# Patient Record
Sex: Female | Born: 1995 | Race: White | Hispanic: No | Marital: Single | Smoking: Never smoker
Health system: Southern US, Community
[De-identification: ages and names within clinical notes are randomized; demographics above are authoritative.]

## PROBLEM LIST (undated history)

## (undated) DIAGNOSIS — M79609 Pain in unspecified limb: Secondary | ICD-10-CM

## (undated) DIAGNOSIS — R209 Unspecified disturbances of skin sensation: Secondary | ICD-10-CM

## (undated) DIAGNOSIS — R569 Unspecified convulsions: Secondary | ICD-10-CM

## (undated) DIAGNOSIS — G43009 Migraine without aura, not intractable, without status migrainosus: Secondary | ICD-10-CM

## (undated) DIAGNOSIS — G809 Cerebral palsy, unspecified: Secondary | ICD-10-CM

## (undated) DIAGNOSIS — R51 Headache: Secondary | ICD-10-CM

## (undated) DIAGNOSIS — R55 Syncope and collapse: Secondary | ICD-10-CM

## (undated) DIAGNOSIS — G43909 Migraine, unspecified, not intractable, without status migrainosus: Secondary | ICD-10-CM

## (undated) HISTORY — DX: Pain in unspecified limb: M79.609

## (undated) HISTORY — DX: Migraine without aura, not intractable, without status migrainosus: G43.009

## (undated) HISTORY — DX: Syncope and collapse: R55

## (undated) HISTORY — PX: EYE SURGERY: SHX253

## (undated) HISTORY — DX: Headache: R51

## (undated) HISTORY — PX: TOOTH EXTRACTION: SUR596

## (undated) HISTORY — DX: Unspecified disturbances of skin sensation: R20.9

## (undated) HISTORY — DX: Migraine, unspecified, not intractable, without status migrainosus: G43.909

---
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
## 2014-01-14 ENCOUNTER — Ambulatory Visit (INDEPENDENT_AMBULATORY_CARE_PROVIDER_SITE_OTHER): Payer: Medicaid Other

## 2014-01-14 ENCOUNTER — Telehealth: Payer: Self-pay | Admitting: Neurology

## 2014-01-14 DIAGNOSIS — R55 Syncope and collapse: Secondary | ICD-10-CM

## 2014-01-14 DIAGNOSIS — M79609 Pain in unspecified limb: Secondary | ICD-10-CM

## 2014-01-14 DIAGNOSIS — R209 Unspecified disturbances of skin sensation: Secondary | ICD-10-CM

## 2014-01-14 DIAGNOSIS — R51 Headache: Secondary | ICD-10-CM

## 2014-01-14 NOTE — Telephone Encounter (Signed)
I called the patient. The EEG study was unremarkable. She has had one near syncopal events since being on Topamax. We'll follow her on this medication, see her back in several months.

## 2014-01-14 NOTE — Procedures (Signed)
    History:  Sara Hubbard is an 18 year old patient with a history of cerebral palsy and possible seizures as a child. She has episodes of feeling lightheaded, blanching the face without syncope. The patient is being evaluated for these episodes.  This is a routine EEG. No skull defects are noted. Medications include Depo-Provera, Topamax, and Ultram.   EEG classification: Normal awake and asleep  Description of the recording: The background rhythms of this recording consists of a fairly well modulated medium amplitude background activity of 9 Hz. As the record progresses, the patient initially is in the waking state, but appears to enter the early stage II sleep during the recording, with rudimentary sleep spindles and vertex sharp wave activity seen. During the wakeful state, photic stimulation is performed, and this results in a bilateral and symmetric photic driving response. Hyperventilation was also performed, and this results in a minimal buildup of the background rhythm activities without significant slowing seen. At no time during the recording does there appear to be evidence of spike or spike wave discharges or evidence of focal slowing. EKG monitor shows no evidence of cardiac rhythm abnormalities with a heart rate of 56.  Impression: This is a normal EEG recording in the waking and sleeping state. No evidence of ictal or interictal discharges were seen at any time during the recording.

## 2014-01-29 ENCOUNTER — Other Ambulatory Visit: Payer: Medicaid Other

## 2014-01-29 ENCOUNTER — Ambulatory Visit
Admission: RE | Admit: 2014-01-29 | Discharge: 2014-01-29 | Disposition: A | Discharge status: Home / Self Care | Payer: Medicaid Other | Source: Ambulatory Visit | Attending: Neurology | Admitting: Neurology

## 2014-01-29 DIAGNOSIS — R209 Unspecified disturbances of skin sensation: Secondary | ICD-10-CM

## 2014-01-29 DIAGNOSIS — M79609 Pain in unspecified limb: Secondary | ICD-10-CM

## 2014-01-29 MED ORDER — GADOBENATE DIMEGLUMINE 529 MG/ML IV SOLN
15.0000 mL | Freq: Once | INTRAVENOUS | Status: AC | PRN
  Administered 2014-01-29: 15 mL via INTRAVENOUS

## 2014-02-03 ENCOUNTER — Telehealth: Payer: Self-pay | Admitting: Neurology

## 2014-02-03 MED ORDER — TOPIRAMATE 100 MG PO TABS: 100.0000 mg | ORAL_TABLET | Freq: Every day | ORAL | Status: DC

## 2014-02-03 NOTE — Telephone Encounter (Signed)
  I called the patient. The MRI of the brain is normal. She had an event of enuresis at night ? Seizure. I will go up on the topamax to 100 mg at night  MRI brain 01/29/14:  Impression   Essentially normal MRI brain (with and without). Significant artifact  eminating from the oropharynx, partially obscures the brain parenchyma on  some sequences.

## 2014-04-07 ENCOUNTER — Encounter: Payer: Self-pay | Admitting: Podiatry

## 2014-04-07 ENCOUNTER — Telehealth: Payer: Self-pay | Admitting: *Deleted

## 2014-04-07 ENCOUNTER — Ambulatory Visit (INDEPENDENT_AMBULATORY_CARE_PROVIDER_SITE_OTHER): Payer: Medicaid Other | Admitting: Podiatry

## 2014-04-07 VITALS — BP 120/60 | HR 73 | Resp 17 | Ht 67.0 in | Wt 170.0 lb

## 2014-04-07 DIAGNOSIS — B351 Tinea unguium: Secondary | ICD-10-CM

## 2014-04-07 NOTE — Telephone Encounter (Signed)
[phone] for definitive diagnosis of fungal elements.

## 2014-04-07 NOTE — Progress Notes (Signed)
   Subjective:    Patient ID: Sara Hubbard, female    DOB: November 27, 1995, 18 y.o.   MRN: [id number]  HPI Comments: N toenail problem L B/L 1st toenails D and O  1 year C white change of color and cracking and peeling A unknown T epsom salt soaks     Review of Systems  Neurological: Positive for seizures, numbness and headaches.       Active seizures.  All other systems reviewed and are negative.      Objective:   Physical Exam  Orientated x3 white female presents with her godmother  Vascular: DP and PT pulses 2/4 bilaterally  Neurological: Ankle reflexes equal and active bilaterally The toenails 6-10and have color changes in texture changes  Musculoskeletal: Pes planus bilateral    Assessment & Plan:   Assessment: Onychomycoses 6-10  Plan: Nail fragments obtained from right and left hallux toenails as submitted for PAS and fungal culture  Notify patient not receive lab

## 2014-05-01 ENCOUNTER — Ambulatory Visit: Payer: Medicaid Other | Admitting: Adult Health

## 2014-05-05 ENCOUNTER — Encounter: Payer: Self-pay | Admitting: Podiatry

## 2014-05-09 ENCOUNTER — Encounter: Payer: Self-pay | Admitting: Adult Health

## 2014-05-09 ENCOUNTER — Telehealth: Payer: Self-pay | Admitting: *Deleted

## 2014-05-09 ENCOUNTER — Ambulatory Visit (INDEPENDENT_AMBULATORY_CARE_PROVIDER_SITE_OTHER): Payer: Medicaid Other | Admitting: Adult Health

## 2014-05-09 VITALS — BP 124/77 | HR 88 | Ht 63.0 in | Wt 166.0 lb

## 2014-05-09 DIAGNOSIS — R209 Unspecified disturbances of skin sensation: Secondary | ICD-10-CM

## 2014-05-09 DIAGNOSIS — R55 Syncope and collapse: Secondary | ICD-10-CM

## 2014-05-09 DIAGNOSIS — R51 Headache: Secondary | ICD-10-CM

## 2014-05-09 NOTE — Progress Notes (Signed)
PATIENT: Sara Hubbard DOB: 02-Mar-1996  REASON FOR VISIT: follow up HISTORY FROM: patient  HISTORY OF PRESENT ILLNESS: Sara Hubbard is an 18 year old female with a history of cerebral palsy, headaches and possible seuzires. She returns today for follow-up. she is currently taking Topamax 100 mg daily. She reports that her headaches have improved. She states that she has approximately two headaches a month.  She reports that she has not had any "seizure like" events since her Topamax was increased. She states that she has had some episodes of dizziness. She states that when she climbs steps she states that her heart will feel like its racing. She will be out of breath and feel dizzy/lightheaded. She went to the ED once for this and they told her she had an irregular heart rhythm but did not give her any further instructions. She does have some numbness in her feet that she states started after she begin Topamax. The numbness is not causing any issues, states she just was curious if it was from the Topamax. She states that she jumps in her sleep a lot. Her boyfriend has noticed it. She states that it will occasionally wake her up. She has had a sleep study but states that it was only 1 hour long.  HISTORY 12/30/13 (CW): 18 year old right-handed white female with a history of cerebral palsy, and possible seizures as a child. Currently, the patient not on antiepileptic medications. Beginning in November 2014, the patient has had some problems with discomfort in the knees on both sides. She will note onset of some numbness in the legs from the upper thigh level down that will come and go, but is not persistent. She has not had any definite issues controlling the bowels or the bladder. The patient has had an episode of staring off, and some incontinence of bowel in January 2015. The patient was told that she may have had a seizure. She is also having very frequent headaches, and she is averaging 14 or 15 headaches  a month. She does have some chronic issues with gait instability. She will have episodes of feeling lightheaded, blanched in the face, but she does not black out. The last episode of feeling lightheaded was on 12/25/2013. She reports some low back pain as well. A recent MRI of the lumbosacral spine was done and this was unremarkable. She is sent to this office for further evaluation.   REVIEW OF SYSTEMS: Full 14 system review of systems performed and notable only for:  Constitutional: N/A  Eyes: N/A Ear/Nose/Throat: N/A  Skin: N/A  Cardiovascular: N/A  Respiratory: N/A  Gastrointestinal: N/A  Genitourinary: N/A Hematology/Lymphatic: N/A  Endocrine: N/A Musculoskeletal:N/A  Allergy/Immunology: N/A  Neurological: dizziness, headache Psychiatric: N/A Sleep: restless leg   ALLERGIES: No Known Allergies  HOME MEDICATIONS: Outpatient Prescriptions Prior to Visit  Medication Sig Dispense Refill  . medroxyPROGESTERone (DEPO-PROVERA) 150 MG/ML injection Inject 150 mg into the muscle every 3 (three) months.       . topiramate (TOPAMAX) 100 MG tablet Take 1 tablet (100 mg total) by mouth at bedtime.  30 tablet  3   No facility-administered medications prior to visit.    PAST MEDICAL HISTORY: Past Medical History  Diagnosis Date  . Cerebral palsy   . Pain in limb 12/30/2013  . Disturbance of skin sensation 12/30/2013  . Migraine   . Syncope   . Headache(784.0) 12/30/2013    PAST SURGICAL HISTORY: Past Surgical History  Procedure Laterality Date  .  Eye surgery    . Tooth extraction      FAMILY HISTORY: Family History  Problem Relation Age of Onset  . Hypertension Father   . Diabetes Maternal Grandmother   . Diabetes Paternal Grandmother   . Diabetes Mother   . Seizures Neg Hx     SOCIAL HISTORY: History   Social History  . Marital Status: Single    Spouse Name: N/A    Number of Children: 0  . Years of Education: 12th   Occupational History  . student    Social  History Main Topics  . Smoking status: Never Smoker   . Smokeless tobacco: Never Used  . Alcohol Use: No  . Drug Use: No  . Sexual Activity: Not Currently    Birth Control/ Protection: Injection   Other Topics Concern  . Not on file   Social History Narrative   Patient is single with no children.   Patient is right handed.   Patient has high school education.   Patient drinks 1 cup daily.      PHYSICAL EXAM  Filed Vitals:   05/09/14 1337  BP: 124/77  Pulse: 88  Height: $Remove'5\' 3"'MLCTmYT$  (1.6 m)  Weight: 166 lb (75.297 kg)   Body mass index is 29.41 kg/(m^2). Generalized: Well developed, in no acute distress   Neurological examination  Mentation: Alert oriented to time, place, history taking. Follows all commands speech and language fluent Cranial nerve II-XII: Pupils were equal round reactive to light. Extraocular movements were full, visual field were full on confrontational test. Facial sensation and strength were normal.  Uvula tongue midline. Head turning and shoulder shrug  were normal and symmetric. Motor: The motor testing reveals 5 over 5 strength of all 4 extremities. Good symmetric motor tone is noted throughout.  Sensory: Sensory testing is intact to soft touch on all 4 extremities. No evidence of extinction is noted.  Coordination: Cerebellar testing reveals good finger-nose-finger and heel-to-shin bilaterally.  Gait and station: Gait is normal. Tandem gait is unsteady. Romberg is negative but slightly unsteady. No drift is seen.  Reflexes: Deep tendon reflexes are symmetric and normal bilaterally.    DIAGNOSTIC DATA (LABS, IMAGING, TESTING) - I reviewed patient records, labs, notes, testing and imaging myself where available.  Lab Results  Component Value Date   WBC 15.0* 05/01/2007   HGB 14.6 11/26/2013   HCT 43.0 11/26/2013   MCV 88.4 05/01/2007   PLT 264 05/01/2007      Component Value Date/Time   NA 142 11/26/2013 1442   K 3.5* 11/26/2013 1442   CL 105 11/26/2013  1442   CO2 25 05/01/2007 1700   GLUCOSE 100* 11/26/2013 1442   BUN 13 11/26/2013 1442   CREATININE 0.70 11/26/2013 1442   CALCIUM 9.7 05/01/2007 1700   GFRNONAA NOT CALCULATED 05/01/2007 1700   GFRAA  Value: NOT CALCULATED        The eGFR has been calculated using the MDRD equation. This calculation has not been validated in all clinical 05/01/2007 1700   ASSESSMENT AND PLAN 18 y.o. year old female  has a past medical history of Cerebral palsy; Pain in limb (12/30/2013); Disturbance of skin sensation (12/30/2013); Migraine; Syncope; and Headache(784.0) (12/30/2013). here with:  1. Migraines 2. Possible seizures  Since starting Topamax the patient had not had any " seizure like" events. Her migraines have also improved. She is now only having two headaches a month. She should continue the Topamax. Patient does complain of her heart racing, SOB  and dizzy/lightheaded after climbing several steps. She indicated that she went to the ED at [facility] and was told that she had an irregular heart rhythm but states she received no further instructions. I have instructed her to make an appointment with her PCP and have them evaluate this. Patient verbalized understanding. She should follow-up in 4 months or sooner if needed.   Ward Givens, MSN, NP-C 05/09/2014, 1:40 PM [facility] [address] [place], [place] [address] [phone]  Note: This document was prepared with digital dictation and possible smart phrase technology. Any transcriptional errors that result from this process are unintentional.

## 2014-05-09 NOTE — Telephone Encounter (Signed)
Called patient to see if she can come in earlier, patient agreed and will be in around 2 pm.

## 2014-05-09 NOTE — Patient Instructions (Signed)

## 2014-05-09 NOTE — Progress Notes (Signed)
I have read the note, and I agree with the clinical assessment and plan.  Sara Hubbard   

## 2014-05-26 ENCOUNTER — Ambulatory Visit: Payer: Medicaid Other | Admitting: Podiatry

## 2014-06-02 ENCOUNTER — Ambulatory Visit (INDEPENDENT_AMBULATORY_CARE_PROVIDER_SITE_OTHER): Payer: Medicaid Other | Admitting: Podiatry

## 2014-06-02 ENCOUNTER — Encounter: Payer: Self-pay | Admitting: Podiatry

## 2014-06-02 VITALS — BP 126/64 | HR 86 | Resp 12

## 2014-06-02 DIAGNOSIS — B351 Tinea unguium: Secondary | ICD-10-CM

## 2014-06-02 DIAGNOSIS — Z79899 Other long term (current) drug therapy: Secondary | ICD-10-CM

## 2014-06-02 MED ORDER — TERBINAFINE HCL 250 MG PO TABS: 250.0000 mg | ORAL_TABLET | Freq: Every day | ORAL | Status: DC

## 2014-06-03 NOTE — Progress Notes (Signed)
Patient ID: Sara Hubbard, female   DOB: 1996/08/20, 18 y.o.   MRN: [id number]  Subjective: This patient presents to discuss treatment options for onychomycoses  Objective: Results of [facility] lab received date 04/15/2014 PAS reaction demonstrates probable dermatophytes Saprophytic fungi detected Evidence of microtrauma  Assessment: Lab confirmation of onychomycoses  Plan: Discuss treatment options with patient including no treatment, topical, oral, laser. Advised patient that the most predictable results was  oral medication. Patient requests oral medication  Issued request for baseline hepatic function and if within normal limits began Terbinafine 250 mg #30,  1 daily,2 refills  Notify patient of hepatic function and if with in normal limits to begin terbinafine  Reappoint x1 month

## 2014-06-17 ENCOUNTER — Telehealth: Payer: Self-pay | Admitting: *Deleted

## 2014-06-17 NOTE — Telephone Encounter (Signed)
I had an appointment about 2 weeks ago and I've never gotten a call back about my blood work to see if I can take my medicine yet.  If you would, please give me a call back.  Thank you.

## 2014-06-24 NOTE — Telephone Encounter (Signed)
I called [facility] to get the results.  I was informed they never received a specimen.  I told her I would check with the patient because she is calling for the results.  I called the patient and informed her that we have not received any lab results.  I asked her if she had it done by somewhere else than [facility].  She stated she went to her primary care doctor.  I asked if she could call and get them to fax the results to us. Tthen we can let you know whether to proceed or not.  She stated, "I filled out the paperwork for them to release it to you.  I don't know why they haven't sent it to you.  I been waiting for weeks to start the medicine."  I asked her again to call them to get the results sent to us.  She stated she would.

## 2014-06-27 ENCOUNTER — Telehealth: Payer: Self-pay

## 2014-06-27 NOTE — Telephone Encounter (Signed)
Spoke with patient regarding hepatic liver function panel, Results were within normal range, advised patient to begin taking lamisil as prescribed and reappointed one month from today for re-evaluation

## 2014-06-30 ENCOUNTER — Ambulatory Visit: Payer: Medicaid Other | Admitting: Podiatry

## 2014-07-02 ENCOUNTER — Telehealth: Payer: Self-pay | Admitting: *Deleted

## 2014-07-02 DIAGNOSIS — B351 Tinea unguium: Secondary | ICD-10-CM

## 2014-07-02 NOTE — Telephone Encounter (Signed)
I'm calling because I had an incident with my medicine.  My niece got a hold of it and threw it away.  I'm wondering if I can get a refill.  (Lamisil)

## 2014-07-07 MED ORDER — TERBINAFINE HCL 250 MG PO TABS: 250.0000 mg | ORAL_TABLET | Freq: Every day | ORAL | Status: DC

## 2014-07-07 NOTE — Telephone Encounter (Signed)
I called and left her a message that Dr. Leeanne Deeduchman said your labs were okay.  You can start the medication.  He okayed for you to get another prescription.

## 2014-07-28 ENCOUNTER — Ambulatory Visit: Payer: Medicaid Other | Admitting: Podiatry

## 2014-08-11 ENCOUNTER — Ambulatory Visit: Payer: Medicaid Other | Admitting: Podiatry

## 2014-09-08 ENCOUNTER — Encounter: Payer: Self-pay | Admitting: Adult Health

## 2014-09-08 ENCOUNTER — Ambulatory Visit (INDEPENDENT_AMBULATORY_CARE_PROVIDER_SITE_OTHER): Payer: Medicaid Other | Admitting: Adult Health

## 2014-09-08 VITALS — BP 122/75 | HR 86 | Ht 64.0 in | Wt 180.0 lb

## 2014-09-08 DIAGNOSIS — R55 Syncope and collapse: Secondary | ICD-10-CM

## 2014-09-08 DIAGNOSIS — R519 Headache, unspecified: Secondary | ICD-10-CM

## 2014-09-08 DIAGNOSIS — R109 Unspecified abdominal pain: Secondary | ICD-10-CM | POA: Insufficient documentation

## 2014-09-08 DIAGNOSIS — R51 Headache: Secondary | ICD-10-CM

## 2014-09-08 MED ORDER — TOPIRAMATE 100 MG PO TABS: 100.0000 mg | ORAL_TABLET | Freq: Every day | ORAL | Status: DC

## 2014-09-08 NOTE — Patient Instructions (Signed)
Continue Topamax 100 mg daily.  If your headaches increase let us know.  Follow-up with PCP if stomach pain continues.

## 2014-09-08 NOTE — Progress Notes (Signed)
I have read the note, and I agree with the clinical assessment and plan.  Patrina Andreas KEITH   

## 2014-09-08 NOTE — Progress Notes (Signed)
Sara Hubbard: Sara Hubbard DOB: October 20, 1995  REASON FOR VISIT: follow up HISTORY FROM: Sara Hubbard  HISTORY OF PRESENT ILLNESS: Sara Hubbard is a 19 year old female with a history of cerebral palsy, headache and possible seizures. She returns today for follow-up. She is currently taking Topamax 100 mg daily. She reports that she has approximately 2 headaches per month. She states that she can take Ibuprofen and that will resolve it.  She denies any seizure-like events. Since the last visit she did go see her primary care provider for dizziness on exertion and an irregular heart rhythm. She states that they did  an EKG which showed PVCs. She states that the dizziness has gotten better. She has a follow-up with her PCP regarding this in several months. She states that the only new thing is pain in the right side under the breast towards the axilla that is intermittent. It occurs at random times and will resolve spontaneously. She has not told her PCP about this.   HISTORY 05/09/14: Sara Hubbard is an 19 year old female with a history of cerebral palsy, headaches and possible seizures. She returns today for follow-up. she is currently taking Topamax 100 mg daily. She reports that her headaches have improved. She states that she has approximately two headaches a month. She reports that she has not had any "seizure like" events since her Topamax was increased. She states that she has had some episodes of dizziness. She states that when she climbs steps she states that her heart will feel like its racing. She will be out of breath and feel dizzy/lightheaded. She went to the ED once for this and they told her she had an irregular heart rhythm but did not give her any further instructions. She does have some numbness in her feet that she states started after she begin Topamax. The numbness is not causing any issues, states she just was curious if it was from the Topamax. She states that she jumps in her sleep a lot. Her boyfriend  has noticed it. She states that it will occasionally wake her up. She has had a sleep study but states that it was only 1 hour long.  HISTORY 12/30/13 (CW): 19 year old right-handed white female with a history of cerebral palsy, and possible seizures as a child. Currently, the Sara Hubbard not on antiepileptic medications. Beginning in November 2014, the Sara Hubbard has had some problems with discomfort in the knees on both sides. She will note onset of some numbness in the legs from the upper thigh level down that will come and go, but is not persistent. She has not had any definite issues controlling the bowels or the bladder. The Sara Hubbard has had an episode of staring off, and some incontinence of bowel in January 2015. The Sara Hubbard was told that she may have had a seizure. She is also having very frequent headaches, and she is averaging 14 or 15 headaches a month. She does have some chronic issues with gait instability. She will have episodes of feeling lightheaded, blanched in the face, but she does not black out. The last episode of feeling lightheaded was on 12/25/2013. She reports some low back pain as well. A recent MRI of the lumbosacral spine was done and this was unremarkable. She is sent to this office for further evaluation.  REVIEW OF SYSTEMS: Out of a complete 14 system review of symptoms, the Sara Hubbard complains only of the following symptoms, and all other reviewed systems are negative.  ALLERGIES: No Known Allergies  HOME MEDICATIONS: Outpatient Prescriptions Prior to Visit  Medication Sig Dispense Refill  . medroxyPROGESTERone (DEPO-PROVERA) 150 MG/ML injection Inject 150 mg into the muscle every 3 (three) months.     . terbinafine (LAMISIL) 250 MG tablet Take 1 tablet (250 mg total) by mouth daily. 30 tablet 2  . topiramate (TOPAMAX) 100 MG tablet Take 1 tablet (100 mg total) by mouth at bedtime. 30 tablet 3   No facility-administered medications prior to visit.    PAST MEDICAL  HISTORY: Past Medical History  Diagnosis Date  . Cerebral palsy   . Pain in limb 12/30/2013  . Disturbance of skin sensation 12/30/2013  . Migraine   . Syncope   . Headache(784.0) 12/30/2013    PAST SURGICAL HISTORY: Past Surgical History  Procedure Laterality Date  . Eye surgery    . Tooth extraction      FAMILY HISTORY: Family History  Problem Relation Age of Onset  . Hypertension Father   . Diabetes Maternal Grandmother   . Diabetes Paternal Grandmother   . Diabetes Mother   . Seizures Neg Hx     SOCIAL HISTORY: History   Social History  . Marital Status: Single    Spouse Name: N/A    Number of Children: 0  . Years of Education: 12th   Occupational History  . student    Social History Main Topics  . Smoking status: Never Smoker   . Smokeless tobacco: Never Used  . Alcohol Use: No  . Drug Use: No  . Sexual Activity: Not Currently    Birth Control/ Protection: Injection   Other Topics Concern  . Not on file   Social History Narrative   Sara Hubbard is single with no children.   Sara Hubbard is right handed.   Sara Hubbard has high school education.   Sara Hubbard drinks 1 cup daily.      PHYSICAL EXAM  Filed Vitals:   09/08/14 1430  BP: 122/75  Pulse: 86  Height: $Remove'5\' 4"'OetHqAF$  (1.626 m)  Weight: 180 lb (81.647 kg)   Body mass index is 30.88 kg/(m^2). Generalized: Well developed, in no acute distress   Abdomen: No tenderness noted on palpation.  Neurological examination  Mentation: Alert oriented to time, place, history taking. Follows all commands speech and language fluent Cranial nerve II-XII: Pupils were equal round reactive to light. Extraocular movements were full, visual field were full on confrontational test. Facial sensation and strength were normal. Uvula tongue midline. Head turning and shoulder shrug  were normal and symmetric. Motor: The motor testing reveals 5 over 5 strength of all 4 extremities. Good symmetric motor tone is noted throughout.  Sensory:  Sensory testing is intact to soft touch on all 4 extremities. No evidence of extinction is noted.  Coordination: Cerebellar testing reveals good finger-nose-finger and heel-to-shin bilaterally.  Gait and station: Gait is normal. Tandem gait unsteady. Romberg is negative. No drift is seen.  Reflexes: Deep tendon reflexes are symmetric and normal bilaterally.    DIAGNOSTIC DATA (LABS, IMAGING, TESTING) - I reviewed Sara Hubbard records, labs, notes, testing and imaging myself where available.  Lab Results  Component Value Date   WBC 15.0* 05/01/2007   HGB 14.6 11/26/2013   HCT 43.0 11/26/2013   MCV 88.4 05/01/2007   PLT 264 05/01/2007      Component Value Date/Time   NA 142 11/26/2013 1442   K 3.5* 11/26/2013 1442   CL 105 11/26/2013 1442   CO2 25 05/01/2007 1700   GLUCOSE 100* 11/26/2013 1442   BUN  13 11/26/2013 1442   CREATININE 0.70 11/26/2013 1442   CALCIUM 9.7 05/01/2007 1700   GFRNONAA NOT CALCULATED 05/01/2007 1700   GFRAA  05/01/2007 1700    NOT CALCULATED        The eGFR has been calculated using the MDRD equation. This calculation has not been validated in all clinical      ASSESSMENT AND PLAN 19 y.o. year old female  has a past medical history of Cerebral palsy; Pain in limb (12/30/2013); Disturbance of skin sensation (12/30/2013); Migraine; Syncope; and Headache(784.0) (12/30/2013). here with:  1. Migraines  Overall the Sara Hubbard has remained the same. She has approximately one to 2 headaches a month. She will continue taking Topamax 100 mg daily. I will refill this today. She denies any seizure-like events. I have advised the Sara Hubbard to follow up with her primary care provider regarding the right-sided pain. If her symptoms worsen or she develops new symptoms she should let us know. She will follow-up in 6 months or sooner if needed.  Ward Givens, MSN, NP-C 09/08/2014, 2:31 PM [facility] [address] [place], [place] [address] [phone]  Note: This document was prepared with digital dictation and possible smart phrase technology. Any transcriptional errors that result from this process are unintentional.

## 2014-10-27 ENCOUNTER — Telehealth: Payer: Self-pay | Admitting: Adult Health

## 2014-10-27 MED ORDER — TOPIRAMATE 100 MG PO TABS: ORAL_TABLET | ORAL | Status: DC

## 2014-10-27 NOTE — Telephone Encounter (Signed)
Spoke to patient and she is scheduled for 12-26-14 at 0900.

## 2014-10-27 NOTE — Telephone Encounter (Signed)
Spoke to patient and she has been fighting a sinus infection, she has not missed any of her seizure medication, and has not started any new medications.  She relayed that she got really hot and then started shaking , her breathing slowed.  She was conscious, and then was sleepy afterward.

## 2014-10-27 NOTE — Telephone Encounter (Signed)
I called the patient. She states that during her seizure she was shaking in the upper torso. She was fully conscious and was able to go lay down. She denies losing her bowels or bladder. Not sure that this represents a true seizure event. She has had a sinus infection and was taking over-the-counter cold medication. She is unsure of the name of the medication she was taking. I have advised the patient to stop taking that medication until she can tell me the name of it. I will increase her Topamax to have a tablet in the a.m. and 1 tablet in the p.m. She has a follow-up appointment with me in July I will try to get the patient in within the next 2-3 months.

## 2014-10-27 NOTE — Telephone Encounter (Signed)
Lupita LeashDonna, Can you call the patient and get more details? Has she been taking her medication? Change in medication? Has she been sick or stressed? Has she started any new medication? What did her seizure consist of? Thanks.

## 2014-10-27 NOTE — Telephone Encounter (Signed)
Pt is calling due to having a seizure this morning and she has not felt well for the past couple of days just wants to sleep.  Would like for a nurse to give her a call. Please call and advise.

## 2014-11-03 ENCOUNTER — Telehealth: Payer: Self-pay | Admitting: Adult Health

## 2014-11-03 NOTE — Telephone Encounter (Signed)
Patient's requesting Megan, NP, assistance with Senior Project regarding Cerebral Palsy.  Please call and advise.

## 2014-11-04 NOTE — Telephone Encounter (Signed)
I called the patient. She would like some questions answered regarding cerebral palsy for her senior project. I gave her my email so she can email questions.

## 2014-12-15 ENCOUNTER — Ambulatory Visit: Payer: Medicaid Other | Admitting: Podiatry

## 2014-12-26 ENCOUNTER — Ambulatory Visit: Payer: Self-pay | Admitting: Adult Health

## 2015-03-11 ENCOUNTER — Ambulatory Visit (INDEPENDENT_AMBULATORY_CARE_PROVIDER_SITE_OTHER): Payer: Medicaid Other | Admitting: Adult Health

## 2015-03-11 ENCOUNTER — Encounter: Payer: Self-pay | Admitting: Adult Health

## 2015-03-11 VITALS — BP 115/70 | HR 84 | Ht 64.0 in | Wt 185.0 lb

## 2015-03-11 DIAGNOSIS — R51 Headache: Secondary | ICD-10-CM | POA: Diagnosis not present

## 2015-03-11 DIAGNOSIS — G809 Cerebral palsy, unspecified: Secondary | ICD-10-CM | POA: Diagnosis not present

## 2015-03-11 DIAGNOSIS — R519 Headache, unspecified: Secondary | ICD-10-CM

## 2015-03-11 NOTE — Patient Instructions (Signed)
Continue Topamax.

## 2015-03-11 NOTE — Progress Notes (Signed)
PATIENT: Sara Hubbard DOB: [birth year]  REASON FOR VISIT: follow up- headache HISTORY FROM: patient  HISTORY OF PRESENT ILLNESS: [name] is a 19 year old female with a history of cerebral palsy, headaches and possible seizures. She returns today for follow-up. She continues to take Topamax 50 mg in a.m. and 100 mg in the PM. She reports that this has been beneficial for her headaches. She states that she may get 2 or 3 headaches a month. This is usually triggered by the weather. She has not had any seizure-type events since the last visit. She states that she did have a panic attack during a chemistry test. The patient just recently graduated from [employer] and will be attending [employer] in the fall. Denies any new medical issues. She returns today for an evaluation.  HISTORY 09/08/14: [name] is a 19 year old female with a history of cerebral palsy, headache and possible seizures. She returns today for follow-up. She is currently taking Topamax 100 mg daily. She reports that she has approximately 2 headaches per month. She states that she can take Ibuprofen and that will resolve it. She denies any seizure-like events. Since the last visit she did go see her primary care provider for dizziness on exertion and an irregular heart rhythm. She states that they did an EKG which showed PVCs. She states that the dizziness has gotten better. She has a follow-up with her PCP regarding this in several months. She states that the only new thing is pain in the right side under the breast towards the axilla that is intermittent. It occurs at random times and will resolve spontaneously. She has not told her PCP about this.   HISTORY 05/09/14: [name] is an 19 year old female with a history of cerebral palsy, headaches and possible seizures. She returns today for follow-up. she is currently taking Topamax 100 mg daily. She reports that her headaches have improved. She states that she has  approximately two headaches a month. She reports that she has not had any "seizure like" events since her Topamax was increased. She states that she has had some episodes of dizziness. She states that when she climbs steps she states that her heart will feel like its racing. She will be out of breath and feel dizzy/lightheaded. She went to the ED once for this and they told her she had an irregular heart rhythm but did not give her any further instructions. She does have some numbness in her feet that she states started after she begin Topamax. The numbness is not causing any issues, states she just was curious if it was from the Topamax. She states that she jumps in her sleep a lot. Her boyfriend has noticed it. She states that it will occasionally wake her up. She has had a sleep study but states that it was only 1 hour long.  HISTORY 12/30/13 (CW): [age] year old right-handed white female with a history of cerebral palsy, and possible seizures as a child. Currently, the patient not on antiepileptic medications. Beginning in November 2014, the patient has had some problems with discomfort in the knees on both sides. She will note onset of some numbness in the legs from the upper thigh level down that will come and go, but is not persistent. She has not had any definite issues controlling the bowels or the bladder. The patient has had an episode of staring off, and some incontinence of bowel in January 2015. The patient was told that she may have  had a seizure. She is also having very frequent headaches, and she is averaging 14 or 15 headaches a month. She does have some chronic issues with gait instability. She will have episodes of feeling lightheaded, blanched in the face, but she does not black out. The last episode of feeling lightheaded was on 12/25/2013. She reports some low back pain as well. A recent MRI of the lumbosacral spine was done and this was unremarkable. She is sent to this office for further  evaluation.  REVIEW OF SYSTEMS: Out of a complete 14 system review of symptoms, the patient complains only of the following symptoms, and all other reviewed systems are negative.  Seizure  ALLERGIES: No Known Allergies  HOME MEDICATIONS: Outpatient Prescriptions Prior to Visit  Medication Sig Dispense Refill  . medroxyPROGESTERone (DEPO-PROVERA) 150 MG/ML injection Inject 150 mg into the muscle every 3 (three) months.     . terbinafine (LAMISIL) 250 MG tablet Take 1 tablet (250 mg total) by mouth daily. 30 tablet 2  . topiramate (TOPAMAX) 100 MG tablet Take 1/2 tablet in the a.m. and 1 tablet in the p.m. 45 tablet 3   No facility-administered medications prior to visit.    PAST MEDICAL HISTORY: Past Medical History  Diagnosis Date  . Cerebral palsy   . Pain in limb 12/30/2013  . Disturbance of skin sensation 12/30/2013  . Migraine   . Syncope   . Headache(784.0) 12/30/2013    PAST SURGICAL HISTORY: Past Surgical History  Procedure Laterality Date  . Eye surgery    . Tooth extraction      FAMILY HISTORY: Family History  Problem Relation Age of Onset  . Hypertension Father   . Diabetes Maternal Grandmother   . Diabetes Paternal Grandmother   . Diabetes Mother   . Seizures Neg Hx     SOCIAL HISTORY: History   Social History  . Marital Status: Single    Spouse Name: N/A  . Number of Children: 0  . Years of Education: 12th   Occupational History  . student    Social History Main Topics  . Smoking status: Never Smoker   . Smokeless tobacco: Never Used  . Alcohol Use: No  . Drug Use: No  . Sexual Activity: Not Currently    Birth Control/ Protection: Injection   Other Topics Concern  . Not on file   Social History Narrative   Patient is single with no children.   Patient is right handed.   Patient has college education.   Patient drinks 1 cup daily.      PHYSICAL EXAM  Filed Vitals:   03/11/15 1144  BP: 115/70  Pulse: 84  Height: 5\' 4"  (1.626  m)  Weight: 185 lb (83.915 kg)   Body mass index is 31.74 kg/(m^2).  Generalized: Well developed, in no acute distress   Neurological examination  Mentation: Alert oriented to time, place, history taking. Follows all commands speech and language fluent Cranial nerve II-XII: Pupils were equal round reactive to light. Extraocular movements were full, visual field were full on confrontational test. Facial sensation and strength were normal. Uvula tongue midline. Head turning and shoulder shrug  were normal and symmetric. Motor: The motor testing reveals 5 over 5 strength of all 4 extremities. Good symmetric motor tone is noted throughout.  Sensory: Sensory testing is intact to soft touch on all 4 extremities. No evidence of extinction is noted.  Coordination: Cerebellar testing reveals good finger-nose-finger and heel-to-shin bilaterally.  Gait and station: Gait is  normal.  Reflexes: Deep tendon reflexes are symmetric and normal bilaterally.   DIAGNOSTIC DATA (LABS, IMAGING, TESTING) - I reviewed patient records, labs, notes, testing and imaging myself where available.   ASSESSMENT AND PLAN 19 y.o. year old female  has a past medical history of Cerebral palsy; Pain in limb (12/30/2013); Disturbance of skin sensation (12/30/2013); Migraine; Syncope; and Headache(784.0) (12/30/2013). here with:  1. Headache 2. Cerebral palsy  Overall the patient is doing well. She will continue on Topamax 50 mg in the a.m. and 100 mg in the PM.  if her symptoms worsen or she develops new symptoms she should let us know. She will follow-up in 6 months or sooner if needed.     Butch Penny, MSN, NP-C 03/11/2015, 11:48 AM [facility] [address] [place], [place] [address] [phone]  Note: This document was prepared with digital dictation and possible smart phrase technology. Any transcriptional errors that result from this process are unintentional.

## 2015-03-27 ENCOUNTER — Telehealth: Payer: Self-pay | Admitting: Adult Health

## 2015-03-27 NOTE — Telephone Encounter (Signed)
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 It is for reasonable accomodation verification for college housing form that needs to be filled out. They are going to refax forms.for any questions please call  [phone], Dierdre Highman , or Amil Amen on Tuesday.

## 2015-03-31 NOTE — Telephone Encounter (Addendum)
Amil Amen from [employer] called and said she received the fax stating that the patient needed to sign it and fax it back but the patient will not be at the school until late August. The release needs to be sent to the patient. Please call and advise. [phone]).

## 2015-04-03 ENCOUNTER — Encounter: Payer: Self-pay | Admitting: Neurology

## 2015-04-03 ENCOUNTER — Ambulatory Visit (INDEPENDENT_AMBULATORY_CARE_PROVIDER_SITE_OTHER): Payer: Medicaid Other | Admitting: Neurology

## 2015-04-03 VITALS — BP 119/85 | HR 85 | Ht 64.0 in | Wt 187.4 lb

## 2015-04-03 DIAGNOSIS — R51 Headache: Secondary | ICD-10-CM

## 2015-04-03 DIAGNOSIS — R55 Syncope and collapse: Secondary | ICD-10-CM

## 2015-04-03 DIAGNOSIS — R519 Headache, unspecified: Secondary | ICD-10-CM

## 2015-04-03 MED ORDER — TOPIRAMATE ER 200 MG PO CAP24: 200.0000 mg | ORAL_CAPSULE | Freq: Every day | ORAL | Status: DC

## 2015-04-03 NOTE — Patient Instructions (Addendum)
We will check an EEG study, you are not to drive a car until further notice. We will go to long acting topiramate at 200 mg at night. Call if you are having problems. Follow up in 3 months.   Syncope Syncope is a medical term for fainting or passing out. This means you lose consciousness and drop to the ground. People are generally unconscious for less than 5 minutes. You may have some muscle twitches for up to 15 seconds before waking up and returning to normal. Syncope occurs more often in older adults, but it can happen to anyone. While most causes of syncope are not dangerous, syncope can be a sign of a serious medical problem. It is important to seek medical care.  CAUSES  Syncope is caused by a sudden drop in blood flow to the brain. The specific cause is often not determined. Factors that can bring on syncope include:  Taking medicines that lower blood pressure.  Sudden changes in posture, such as standing up quickly.  Taking more medicine than prescribed.  Standing in one place for too long.  Seizure disorders.  Dehydration and excessive exposure to heat.  Low blood sugar (hypoglycemia).  Straining to have a bowel movement.  Heart disease, irregular heartbeat, or other circulatory problems.  Fear, emotional distress, seeing blood, or severe pain. SYMPTOMS  Right before fainting, you may:  Feel dizzy or light-headed.  Feel nauseous.  See all white or all black in your field of vision.  Have cold, clammy skin. DIAGNOSIS  Your health care provider will ask about your symptoms, perform a physical exam, and perform an electrocardiogram (ECG) to record the electrical activity of your heart. Your health care provider may also perform other heart or blood tests to determine the cause of your syncope which may include:  Transthoracic echocardiogram (TTE). During echocardiography, sound waves are used to evaluate how blood flows through your heart.  Transesophageal  echocardiogram (TEE).  Cardiac monitoring. This allows your health care provider to monitor your heart rate and rhythm in real time.  Holter monitor. This is a portable device that records your heartbeat and can help diagnose heart arrhythmias. It allows your health care provider to track your heart activity for several days, if needed.  Stress tests by exercise or by giving medicine that makes the heart beat faster. TREATMENT  In most cases, no treatment is needed. Depending on the cause of your syncope, your health care provider may recommend changing or stopping some of your medicines. HOME CARE INSTRUCTIONS  Have someone stay with you until you feel stable.  Do not drive, use machinery, or play sports until your health care provider says it is okay.  Keep all follow-up appointments as directed by your health care provider.  Lie down right away if you start feeling like you might faint. Breathe deeply and steadily. Wait until all the symptoms have passed.  Drink enough fluids to keep your urine clear or pale yellow.  If you are taking blood pressure or heart medicine, get up slowly and take several minutes to sit and then stand. This can reduce dizziness. SEEK IMMEDIATE MEDICAL CARE IF:   You have a severe headache.  You have unusual pain in the chest, abdomen, or back.  You are bleeding from your mouth or rectum, or you have black or tarry stool.  You have an irregular or very fast heartbeat.  You have pain with breathing.  You have repeated fainting or seizure-like jerking during  an episode.  You faint when sitting or lying down.  You have confusion.  You have trouble walking.  You have severe weakness.  You have vision problems. If you fainted, call your local emergency services (911 in [place] Do not drive yourself to the hospital.  MAKE SURE YOU:  Understand these instructions.  Will watch your condition.  Will get help right away if you are not doing well  or get worse. Document Released: 08/22/2005 Document Revised: 08/27/2013 Document Reviewed: 10/21/2011 [facility] Patient Information 2015 [place], [place]. This information is not intended to replace advice given to you by your health care provider. Make sure you discuss any questions you have with your health care provider.

## 2015-04-03 NOTE — Progress Notes (Signed)
Reason for visit: Syncope  Sara Hubbard is an 19 y.o. female  History of present illness:  Sara Hubbard is a 19 year old right-handed white female with a history of cerebral palsy, and migraine headaches. The patient has had episodes of loss of consciousness or near loss of consciousness in the past. She has had an EEG study last year that was normal. The patient has had episodes of feeling lightheaded, with blanching of the face without loss of consciousness. The patient has also had episodes of jerking of the upper extremities without loss of consciousness. The patient recently was at the hospital at [facility] visiting her brother. They were doing endoscopy at bedside on her brother, and the patient apparently lost consciousness while sitting in a chair, had some jerking, and slid out of the chair to the floor. The patient was noted to be diaphoretic. The patient did have urinary incontinence, she did not bite her tongue. She went to the emergency room, but no evaluation was done. The patient was increased on the Topamax to 50 mg in the morning and 100 mg in the evening in February 2016. The patient apparently is still operating a motor vehicle. The patient indicates that she does not remember much about the most recent blackout event, she does not recall feeling dizzy, but she felt like she was in a "dream state" as she was going out. Her headaches have been under relatively good control. She returns for an evaluation.  Past Medical History  Diagnosis Date  . Cerebral palsy   . Pain in limb 12/30/2013  . Disturbance of skin sensation 12/30/2013  . Migraine   . Syncope   . ZHYQMVHQ(469.6) 12/30/2013    Past Surgical History  Procedure Laterality Date  . Eye surgery    . Tooth extraction      Family History  Problem Relation Age of Onset  . Hypertension Father   . Diabetes Maternal Grandmother   . Diabetes Paternal Grandmother   . Diabetes Mother   . Seizures Neg Hx     Social history:   reports that she has never smoked. She has never used smokeless tobacco. She reports that she does not drink alcohol or use illicit drugs.   No Known Allergies  Medications:  Prior to Admission medications   Medication Sig Start Date End Date Taking? Authorizing Provider  medroxyPROGESTERone (DEPO-PROVERA) 150 MG/ML injection Inject 150 mg into the muscle every 3 (three) months.     Historical Provider, MD  topiramate (TOPAMAX) 100 MG tablet Take 1/2 tablet in the a.m. and 1 tablet in the p.m. 10/27/14   Butch Penny, NP    ROS:  Out of a complete 14 system review of symptoms, the patient complains only of the following symptoms, and all other reviewed systems are negative.  Blackout  Blood pressure 119/85, pulse 85, height  (1.626 m), weight 187 lb 6.4 oz (85.004 kg).  Physical Exam  General: The patient is alert and cooperative at the time of the examination. The patient is moderately obese.  Skin: No significant peripheral edema is noted.   Neurologic Exam  Mental status: The patient is alert and oriented x 3 at the time of the examination. The patient has apparent normal recent and remote memory, with an apparently normal attention span and concentration ability.   Cranial nerves: Facial symmetry is present. Speech is normal, no aphasia or dysarthria is noted. Extraocular movements are full, with exception that there is incomplete adduction of the left  eye with right lateral gaze. Visual fields are full.  Motor: The patient has good strength in all 4 extremities.  Sensory examination: Soft touch sensation is symmetric on the face, arms, and legs.  Coordination: The patient has good finger-nose-finger and heel-to-shin bilaterally.  Gait and station: The patient has a normal gait. Tandem gait is unsteady. Romberg is negative. No drift is seen.  Reflexes: Deep tendon reflexes are symmetric.   Assessment/Plan:  1. Cerebral palsy  2. Migraine headache  3.  Syncopal events, question of seizures  The patient has had multiple events that appear to be dissimilar to one another, some events are associated with blanching of the face and dizziness, some are associated with jerking of the upper extremities without loss of consciousness, and the most recent event was associated with some jerking and urinary incontinence, loss of consciousness was noted. The events could represent vasovagal syncope, seizures do need to be considered. The patient is not to operate a motor vehicle until further notice. The patient will be switched over to long-acting topiramate taking 200 mg at night. I have cautioned them about the effects on the birth control, there is an interaction. The patient will have another EEG study. We may consider a video EEG evaluation in the future. There is some history of palpitations of the heart, the patient was told that she had an irregular heartbeat on EKG, but a cardiology evaluation was not recommended. The patient will follow-up in 3 months.  Marlan Palau MD 04/04/2015 12:21 PM  [facility] [address] [place], [place] [address]  Phone [phone] Fax [phone]

## 2015-04-07 NOTE — Telephone Encounter (Signed)
Called Sara Hubbard at [employer]. I was told to call Sara Hubbard back after 2:oo pm today.

## 2015-04-07 NOTE — Telephone Encounter (Signed)
Called patient and asked her to give me a call back I need to speak with patient. Thanks Annabelle Harman .

## 2015-04-07 NOTE — Telephone Encounter (Signed)
Pt is calling back

## 2015-04-07 NOTE — Telephone Encounter (Signed)
Called Amil Amen back and Left her a message on voice mail to please return my call.

## 2015-04-08 NOTE — Telephone Encounter (Signed)
Called Amil Amen and again an d left her a message to please give me a call back.

## 2015-04-09 NOTE — Progress Notes (Signed)
I have read the note, and I agree with the clinical assessment and plan.  Richard A. Sater, MD, PhD Certified in Neurology, Clinical Neurophysiology, Sleep Medicine, Pain Medicine and Neuroimaging  [facility] [address], [address] [place], [place] [address] [phone]  

## 2015-04-09 NOTE — Telephone Encounter (Signed)
Called Amil Amen back at [employer] and spoke to Badin. Meg relayed Amil Amen will call me back today.

## 2015-04-13 ENCOUNTER — Ambulatory Visit (INDEPENDENT_AMBULATORY_CARE_PROVIDER_SITE_OTHER): Payer: Medicaid Other | Admitting: Neurology

## 2015-04-13 ENCOUNTER — Telehealth: Payer: Self-pay | Admitting: Neurology

## 2015-04-13 DIAGNOSIS — R51 Headache: Secondary | ICD-10-CM

## 2015-04-13 DIAGNOSIS — R55 Syncope and collapse: Secondary | ICD-10-CM

## 2015-04-13 DIAGNOSIS — R519 Headache, unspecified: Secondary | ICD-10-CM

## 2015-04-13 NOTE — Telephone Encounter (Signed)
Spoke to patient today she will Amil Amen fax number beside me at [phone] I will fill out form and and send back to her.

## 2015-04-13 NOTE — Procedures (Signed)
    History:  Sara Hubbard is a 19 year old patient with a history of migraine headaches and episodes of syncope. The patient has had a recent event associated with blanching the face, and subsequent loss of consciousness. The patient is being evaluated for possible seizures.  This is a routine EEG. No skull defects are noted. Medications include Depo-Provera and Topamax.  EEG classification: Normal awake and asleep  Description of the recording: The background rhythms of this recording consists of a fairly well modulated medium amplitude background activity of 9 Hz. As the record progresses, the patient initially is in the waking state, but appears to enter the early stage II sleep during the recording, with rudimentary sleep spindles and vertex sharp wave activity seen. During the wakeful state, photic stimulation is not performed. Hyperventilation was performed, and this results in a minimal buildup of the background rhythm activities without significant slowing seen. At no time during the recording does there appear to be evidence of spike or spike wave discharges or evidence of focal slowing. EKG monitor shows no evidence of cardiac rhythm abnormalities with a heart rate of 48.  Impression: This is a normal EEG recording in the waking and sleeping state. No evidence of ictal or interictal discharges were seen at any time during the recording. Bradycardia was noted throughout the recording.

## 2015-04-13 NOTE — Telephone Encounter (Signed)
I called the patient. The patient has had an EEG study that was unremarkable. The episode recently describe may have been a fainting episode. The patient has been increased on the Topamax for the migraine, not sure I would restrict driving at this point.

## 2015-04-15 ENCOUNTER — Telehealth: Payer: Self-pay | Admitting: Adult Health

## 2015-04-15 NOTE — Telephone Encounter (Signed)
Faxed form to [facility] and I will also mail patient a copy per her request.

## 2015-04-15 NOTE — Telephone Encounter (Signed)
Called and spoke patient relayed that I received an ok conformation also mailed her a copy. [facility] and was place on hold by  Nance Pear I will have to give office a call back.

## 2015-04-15 NOTE — Telephone Encounter (Signed)
Patient is calling to discuss disability papers. She states the form that was filled out was faxed to the wrong number. Please call and discuss because patient does not have the correct fax number. Thank you.

## 2015-04-16 NOTE — Telephone Encounter (Signed)
Called and spoke to [place] they have form . [employer] relayed they didn't know if it was there phone line , I relayed I received an ok conformation on my end. Patient has been mailed a copy also. And aware of all details.

## 2015-04-30 NOTE — Telephone Encounter (Signed)
Patient called inquiring who Sara Hubbard talked to at [facility] regarding forms. She states Amil Amen is not much help. She can be reached at [phone].

## 2015-04-30 NOTE — Telephone Encounter (Signed)
Called patient back. Patient has what she needs's [employer] found form.

## 2015-07-29 ENCOUNTER — Telehealth: Payer: Self-pay

## 2015-07-29 ENCOUNTER — Encounter: Payer: Self-pay | Admitting: Adult Health

## 2015-07-29 ENCOUNTER — Ambulatory Visit (INDEPENDENT_AMBULATORY_CARE_PROVIDER_SITE_OTHER): Payer: Medicaid Other | Admitting: Adult Health

## 2015-07-29 VITALS — BP 122/68 | HR 69 | Ht 64.0 in | Wt 196.5 lb

## 2015-07-29 DIAGNOSIS — G43009 Migraine without aura, not intractable, without status migrainosus: Secondary | ICD-10-CM | POA: Diagnosis not present

## 2015-07-29 MED ORDER — TOPIRAMATE ER 200 MG PO CAP24: 200.0000 mg | ORAL_CAPSULE | Freq: Every day | ORAL | Status: DC

## 2015-07-29 NOTE — Telephone Encounter (Signed)
Patient did not come to a follow up appointment today.  

## 2015-07-29 NOTE — Patient Instructions (Signed)
Continue Topamax If your symptoms worsen or you develop new symptoms please let us know.   

## 2015-07-29 NOTE — Progress Notes (Signed)
PATIENT: Sara Hubbard DOB: 12/14/1995  REASON FOR VISIT: follow up- migraine headaches HISTORY FROM: patient  HISTORY OF PRESENT ILLNESS: Sara Hubbard is a 19 year old female with a history of cerebral palsy and migraine headaches. She returns today for follow-up visit. At the last visit she had had some syncopal episodes. The patient reports that she's not had any additional episodes. She is tolerating long-acting Topamax very well. She states that she has approximately 1-2 headaches a month. She states these headaches normally occur when she is stressed with school. Overall the patient is doing very well. She states that she is also performing very well in school at this time. She denies any new neurological symptoms. She returns today for an evaluation.   HISTORY 04/03/15: Sara Hubbard is a 19 year old right-handed white female with a history of cerebral palsy, and migraine headaches. The patient has had episodes of loss of consciousness or near loss of consciousness in the past. She has had an EEG study last year that was normal. The patient has had episodes of feeling lightheaded, with blanching of the face without loss of consciousness. The patient has also had episodes of jerking of the upper extremities without loss of consciousness. The patient recently was at the hospital at [facility] visiting her brother. They were doing endoscopy at bedside on her brother, and the patient apparently lost consciousness while sitting in a chair, had some jerking, and slid out of the chair to the floor. The patient was noted to be diaphoretic. The patient did have urinary incontinence, she did not bite her tongue. She went to the emergency room, but no evaluation was done. The patient was increased on the Topamax to 50 mg in the morning and 100 mg in the evening in February 2016. The patient apparently is still operating a motor vehicle. The patient indicates that she does not remember much about the most recent blackout  event, she does not recall feeling dizzy, but she felt like she was in a "dream state" as she was going out. Her headaches have been under relatively good control. She returns for an evaluation.    REVIEW OF SYSTEMS: Out of a complete 14 system review of symptoms, the patient complains only of the following symptoms, and all other reviewed systems are negative.  See history of present illness  ALLERGIES: No Known Allergies  HOME MEDICATIONS: Outpatient Prescriptions Prior to Visit  Medication Sig Dispense Refill  . medroxyPROGESTERone (DEPO-PROVERA) 150 MG/ML injection Inject 150 mg into the muscle every 3 (three) months.     . Topiramate ER (TROKENDI XR) 200 MG CP24 Take 200 mg by mouth at bedtime. 30 capsule 3   No facility-administered medications prior to visit.    PAST MEDICAL HISTORY: Past Medical History  Diagnosis Date  . Cerebral palsy (HCC)   . Pain in limb 12/30/2013  . Disturbance of skin sensation 12/30/2013  . Migraine   . Syncope   . Headache(784.0) 12/30/2013    PAST SURGICAL HISTORY: Past Surgical History  Procedure Laterality Date  . Eye surgery    . Tooth extraction      FAMILY HISTORY: Family History  Problem Relation Age of Onset  . Hypertension Father   . Diabetes Maternal Grandmother   . Diabetes Paternal Grandmother   . Diabetes Mother   . Seizures Neg Hx     SOCIAL HISTORY: Social History   Social History  . Marital Status: Single    Spouse Name: N/A  . Number of Children:  0  . Years of Education: 12th   Occupational History  . student    Social History Main Topics  . Smoking status: Never Smoker   . Smokeless tobacco: Never Used  . Alcohol Use: No  . Drug Use: No  . Sexual Activity: Not Currently    Birth Control/ Protection: Injection   Other Topics Concern  . Not on file   Social History Narrative   Patient is single with no children.   Patient is right handed.   Patient has college education.   Patient drinks 1 cup  daily.      PHYSICAL EXAM  Filed Vitals:   07/29/15 1038  BP: 122/68  Pulse: 69  Height: 5\' 4"  (1.626 m)  Weight: 196 lb 8 oz (89.132 kg)   Body mass index is 33.71 kg/(m^2).  Generalized: Well developed, in no acute distress   Neurological examination  Mentation: Alert oriented to time, place, history taking. Follows all commands speech and language fluent Cranial nerve II-XII: Pupils were equal round reactive to light. Extraocular movements were full, visual field were full on confrontational test. Facial sensation and strength were normal. Uvula tongue midline. Head turning and shoulder shrug  were normal and symmetric. Motor: The motor testing reveals 5 over 5 strength of all 4 extremities. Good symmetric motor tone is noted throughout.  Sensory: Sensory testing is intact to soft touch on all 4 extremities. No evidence of extinction is noted.  Coordination: Cerebellar testing reveals good finger-nose-finger and heel-to-shin bilaterally.  Gait and station: Gait is normal. Tandem gait is unsteady. Romberg is negative. No drift is seen.  Reflexes: Deep tendon reflexes are symmetric and normal bilaterally.   DIAGNOSTIC DATA (LABS, IMAGING, TESTING) - I reviewed patient records, labs, notes, testing and imaging myself where available.   ASSESSMENT AND PLAN 19 y.o. year old female  has a past medical history of Cerebral palsy (HCC); Pain in limb (12/30/2013); Disturbance of skin sensation (12/30/2013); Migraine; Syncope; and Headache(784.0) (12/30/2013). here with:  1. Migraine  Overall the patient is doing well. She will continue on trokendi. Refills have been sent. Patient advised that if her headache frequency or severity increases she should let us know. She will follow-up in 6 months or sooner if needed.   Butch PennyMegan Jazari Ober, MSN, NP-C 07/29/2015, 11:05 AM [facility] [address], [address] [place], [place] [address] [phone]

## 2015-07-29 NOTE — Progress Notes (Signed)
I agree with the above plan 

## 2015-09-10 ENCOUNTER — Ambulatory Visit: Payer: Medicaid Other | Admitting: Adult Health

## 2016-01-26 ENCOUNTER — Ambulatory Visit (INDEPENDENT_AMBULATORY_CARE_PROVIDER_SITE_OTHER): Payer: Medicaid Other | Admitting: Adult Health

## 2016-01-26 ENCOUNTER — Encounter: Payer: Self-pay | Admitting: Adult Health

## 2016-01-26 VITALS — BP 107/73 | HR 78 | Ht 64.0 in | Wt 200.2 lb

## 2016-01-26 DIAGNOSIS — G43009 Migraine without aura, not intractable, without status migrainosus: Secondary | ICD-10-CM | POA: Diagnosis not present

## 2016-01-26 MED ORDER — TOPIRAMATE ER 200 MG PO CAP24: 200.0000 mg | ORAL_CAPSULE | Freq: Every day | ORAL | Status: DC

## 2016-01-26 NOTE — Progress Notes (Signed)
PATIENT: Sara Hubbard DOB: 24-Nov-1995  REASON FOR VISIT: follow up- cerebral palsy, migraine headaches HISTORY FROM: patient  HISTORY OF PRESENT ILLNESS: Sara Hubbard is a 20 year old female with a history of cerebral palsy and migraine headaches. She returns today for follow-up. She continues to take Trokendi 200 mg at bedtime. She reports that she only has approximately 1 migraine a month. She states that when she does get a migraine she goes back to her dorm room and lays in the dark and eventually the headache subsides. She does state that she's been having more anxiety attacks. She does not have any triggers for the anxiety attack. She has had anxiety since she was a child. She has not followed with her primary care for this issue. She denies being depressed. She states that she is enjoying college. She returns today for an evaluation  History 07/29/15: Sara Hubbard is a 20 year old female with a history of cerebral palsy and migraine headaches. She returns today for follow-up visit. At the last visit she had had some syncopal episodes. The patient reports that she's not had any additional episodes. She is tolerating long-acting Topamax very well. She states that she has approximately 1-2 headaches a month. She states these headaches normally occur when she is stressed with school. Overall the patient is doing very well. She states that she is also performing very well in school at this time. She denies any new neurological symptoms. She returns today for an evaluation.   HISTORY 04/03/15: Sara Hubbard is a 20 year old right-handed white female with a history of cerebral palsy, and migraine headaches. The patient has had episodes of loss of consciousness or near loss of consciousness in the past. She has had an EEG study last year that was normal. The patient has had episodes of feeling lightheaded, with blanching of the face without loss of consciousness. The patient has also had episodes of jerking of  the upper extremities without loss of consciousness. The patient recently was at the hospital at [facility] visiting her brother. They were doing endoscopy at bedside on her brother, and the patient apparently lost consciousness while sitting in a chair, had some jerking, and slid out of the chair to the floor. The patient was noted to be diaphoretic. The patient did have urinary incontinence, she did not bite her tongue. She went to the emergency room, but no evaluation was done. The patient was increased on the Topamax to 50 mg in the morning and 100 mg in the evening in February 2016. The patient apparently is still operating a motor vehicle. The patient indicates that she does not remember much about the most recent blackout event, she does not recall feeling dizzy, but she felt like she was in a "dream state" as she was going out. Her headaches have been under relatively good control. She returns for an evaluation.    REVIEW OF SYSTEMS: Out of a complete 14 system review of symptoms, the patient complains only of the following symptoms, and all other reviewed systems are negative.  See history of present illness  ALLERGIES: No Known Allergies  HOME MEDICATIONS: Outpatient Prescriptions Prior to Visit  Medication Sig Dispense Refill  . medroxyPROGESTERone (DEPO-PROVERA) 150 MG/ML injection Inject 150 mg into the muscle every 3 (three) months.     . Topiramate ER (TROKENDI XR) 200 MG CP24 Take 200 mg by mouth at bedtime. 30 capsule 5   No facility-administered medications prior to visit.    PAST MEDICAL HISTORY: Past Medical  History  Diagnosis Date  . Cerebral palsy ([place])   . Pain in limb 12/30/2013  . Disturbance of skin sensation 12/30/2013  . Migraine   . Syncope   . Headache(784.0) 12/30/2013    PAST SURGICAL HISTORY: Past Surgical History  Procedure Laterality Date  . Eye surgery    . Tooth extraction      FAMILY HISTORY: Family History  Problem Relation Age of Onset  .  Hypertension Father   . Diabetes Maternal Grandmother   . Diabetes Paternal Grandmother   . Diabetes Mother   . Seizures Neg Hx     SOCIAL HISTORY: Social History   Social History  . Marital Status: Single    Spouse Name: N/A  . Number of Children: 0  . Years of Education: 12th   Occupational History  . student    Social History Main Topics  . Smoking status: Never Smoker   . Smokeless tobacco: Never Used  . Alcohol Use: No  . Drug Use: No  . Sexual Activity: Not Currently    Birth Control/ Protection: Injection   Other Topics Concern  . Not on file   Social History Narrative   Patient is single with no children.   Patient is right handed.   Patient has college education.   Patient drinks 1 cup daily.      PHYSICAL EXAM  Filed Vitals:   01/26/16 1304  BP: 107/73  Pulse: 78  Height: '5\' 4"'$  (1.626 m)  Weight: 200 lb 3.2 oz (90.81 kg)   Body mass index is 34.35 kg/(m^2).  Generalized: Well developed, in no acute distress   Neurological examination  Mentation: Alert oriented to time, place, history taking. Follows all commands speech and language fluent Cranial nerve II-XII: Pupils were equal round reactive to light. Extraocular movements were full, visual field were full on confrontational test. Facial sensation and strength were normal. Uvula tongue midline. Head turning and shoulder shrug  were normal and symmetric. Motor: The motor testing reveals 5 over 5 strength of all 4 extremities. Good symmetric motor tone is noted throughout.  Sensory: Sensory testing is intact to soft touch on all 4 extremities. No evidence of extinction is noted.  Coordination: Cerebellar testing reveals good finger-nose-finger and heel-to-shin bilaterally.  Gait and station: Gait is normal. Tandem gait is normal. Romberg is negative. No drift is seen.  Reflexes: Deep tendon reflexes are symmetric and normal bilaterally.   DIAGNOSTIC DATA (LABS, IMAGING, TESTING) - I reviewed  patient records, labs, notes, testing and imaging myself where available.      Component Value Date/Time   NA 142 11/26/2013 1442   K 3.5* 11/26/2013 1442   CL 105 11/26/2013 1442   CO2 25 05/01/2007 1700   GLUCOSE 100* 11/26/2013 1442   BUN 13 11/26/2013 1442   CREATININE 0.70 11/26/2013 1442   CALCIUM 9.7 05/01/2007 1700   GFRNONAA NOT CALCULATED 05/01/2007 1700   GFRAA  05/01/2007 1700    NOT CALCULATED        The eGFR has been calculated using the MDRD equation. This calculation has not been validated in all clinical      ASSESSMENT AND PLAN 20 y.o. year old female  has a past medical history of Cerebral palsy ([place]); Pain in limb (12/30/2013); Disturbance of skin sensation (12/30/2013); Migraine; Syncope; and Headache(784.0) (12/30/2013). here with:  1. Migraine headache  Overall the patient has remained stable. She will continue on Trokendi 200 mg at bedtime. I have advised that she should  follow-up with her primary care in regards to her anxiety. Patient verbalized understanding. She will follow-up in 6 months or sooner if needed.     Ward Givens, MSN, NP-C 01/26/2016, 1:10 PM [facility] [address], [place], [place] [address] [phone]

## 2016-01-26 NOTE — Patient Instructions (Signed)
Continue Trokendi 200 mg at bedtime Follow-up with PCP for anxiety If your symptoms worsen or you develop new symptoms please let us know.

## 2016-01-26 NOTE — Progress Notes (Signed)
I agree with the assessment and plan as directed by NP .The patient is known to me .   Daiya Tamer, MD  

## 2016-05-31 ENCOUNTER — Telehealth: Payer: Self-pay | Admitting: *Deleted

## 2016-05-31 NOTE — Telephone Encounter (Signed)
PA initiated for trokendi XR.  200mg  caps. [place] tracks - sent to clinical pharmacy PA # [id number]. Called tomorrow to check on this PA. [phone].

## 2016-06-01 ENCOUNTER — Telehealth: Payer: Self-pay | Admitting: Adult Health

## 2016-06-01 NOTE — Telephone Encounter (Signed)
Sara Hubbard/Fiancee [phone] called to check status of refill request for Topiramate ER (TROKENDI XR) 200 MG CP24, states patient is completely out of this medication, "headaches are really bad", has been trying to get filled since last Thursday or Friday, pharmacy has advised, they can't fill until approval from our office. Sara Hubbard advised, no DPR on file for nurse to discuss with him. Jeralyn BennettDante gave updated phone number for patient, cell [phone].

## 2016-06-01 NOTE — Telephone Encounter (Signed)
I spoke to pt and relayed that I spoke to [place] [employer] yesterday.  Approval was given for trokendi today.   [facility] did not have in stock and will have tomorrow am.   I relayed to pt.  She verbalized understanding.

## 2016-06-01 NOTE — Telephone Encounter (Signed)
PA# approval  [id number] per [place] [employer] . Spoke to [employer].  [phone].  [facility] [place] [facility] and [facility] not [facility].  Will have tomorrow am.   I notified the pt.

## 2016-08-23 ENCOUNTER — Encounter: Payer: Self-pay | Admitting: Neurology

## 2016-08-23 ENCOUNTER — Ambulatory Visit (INDEPENDENT_AMBULATORY_CARE_PROVIDER_SITE_OTHER): Payer: Medicaid Other | Admitting: Neurology

## 2016-08-23 DIAGNOSIS — G43009 Migraine without aura, not intractable, without status migrainosus: Secondary | ICD-10-CM | POA: Diagnosis not present

## 2016-08-23 HISTORY — DX: Migraine without aura, not intractable, without status migrainosus: G43.009

## 2016-08-23 MED ORDER — RIZATRIPTAN BENZOATE 10 MG PO TBDP: 10.0000 mg | ORAL_TABLET | Freq: Three times a day (TID) | ORAL | 3 refills | Status: DC | PRN

## 2016-08-23 MED ORDER — TOPIRAMATE ER 200 MG PO CAP24: 200.0000 mg | ORAL_CAPSULE | Freq: Every day | ORAL | 3 refills | Status: DC

## 2016-08-23 NOTE — Progress Notes (Signed)
Reason for visit: Migraine headache  Sara Hubbard is an 20 y.o. female  History of present illness:  Sara Hubbard is a 20 year old right-handed white female with history of cerebral palsy and migraine headache. The patient is having 1 or 2 headaches a month, she is not using any medications to stop the headache once they start, but they may last about an hour and go away. The patient has not had any more syncopal events. She reports that her paternal grandmother and grandfather had a history of seizures. The patient is on Trokendi taking 200 mg in the evening, as long she is on this medication she does well with the headache. She is tolerating the drug well. She returns to this office for an evaluation.  Past Medical History:  Diagnosis Date  . Cerebral palsy (HCC)   . Disturbance of skin sensation 12/30/2013  . Headache(784.0) 12/30/2013  . Migraine   . Pain in limb 12/30/2013  . Syncope     Past Surgical History:  Procedure Laterality Date  . EYE SURGERY    . TOOTH EXTRACTION      Family History  Problem Relation Age of Onset  . Hypertension Father   . Diabetes Maternal Grandmother   . Diabetes Paternal Grandmother   . Seizures Paternal Grandmother   . Diabetes Mother   . Seizures Paternal Grandfather     Social history:  reports that she has never smoked. She has never used smokeless tobacco. She reports that she does not drink alcohol or use drugs.   No Known Allergies  Medications:  Prior to Admission medications   Medication Sig Start Date End Date Taking? Authorizing Provider  medroxyPROGESTERone (DEPO-PROVERA) 150 MG/ML injection Inject 150 mg into the muscle every 3 (three) months.     Historical Provider, MD  Topiramate ER (TROKENDI XR) 200 MG CP24 Take 200 mg by mouth at bedtime. 01/26/16   Butch PennyMegan Millikan, NP    ROS:  Out of a complete 14 system review of symptoms, the patient complains only of the following symptoms, and all other reviewed systems are  negative.  Headaches  Blood pressure 117/77, pulse 90, height 5\' 4"  (1.626 m), weight 192 lb (87.1 kg).  Physical Exam  General: The patient is alert and cooperative at the time of the examination. The patient is moderately obese.  Skin: No significant peripheral edema is noted.   Neurologic Exam  Mental status: The patient is alert and oriented x 3 at the time of the examination. The patient has apparent normal recent and remote memory, with an apparently normal attention span and concentration ability.   Cranial nerves: Facial symmetry is present. Speech is normal, no aphasia or dysarthria is noted. Extraocular movements are full, with exception of incomplete adduction of both eyes, with superior gaze there is exotropia of the left eye. Visual fields are full.  Motor: The patient has good strength in all 4 extremities.  Sensory examination: Soft touch sensation is symmetric on the face, arms, and legs.  Coordination: The patient has good finger-nose-finger and heel-to-shin bilaterally.  Gait and station: The patient has a normal gait. Tandem gait is unsteady. Romberg is negative. No drift is seen.  Reflexes: Deep tendon reflexes are symmetric.   Assessment/Plan:  1. Common migraine headache  2. Cerebral palsy  The patient will continue the Trokendi, a prescription was called in. The patient will also be given a prescription for Maxalt to take if needed for the headache. She will follow-up in 6  months, sooner if needed.  Marlan Palau. Keith Willis MD 08/23/2016 1:53 PM  [facility] [address] [place], [place] [id number]  Phone [phone] Fax [phone]

## 2016-12-06 ENCOUNTER — Other Ambulatory Visit: Payer: Self-pay | Admitting: Adult Health

## 2016-12-07 ENCOUNTER — Telehealth: Payer: Self-pay

## 2016-12-07 MED ORDER — TOPIRAMATE ER 200 MG PO CAP24: 200.0000 mg | ORAL_CAPSULE | Freq: Every day | ORAL | 3 refills | Status: DC

## 2016-12-07 NOTE — Telephone Encounter (Signed)
Refill request for Trokendi xr  sent to requested pharmacy.

## 2017-02-17 ENCOUNTER — Other Ambulatory Visit: Payer: Self-pay | Admitting: Neurology

## 2017-02-21 ENCOUNTER — Encounter: Payer: Self-pay | Admitting: Adult Health

## 2017-02-21 ENCOUNTER — Ambulatory Visit (INDEPENDENT_AMBULATORY_CARE_PROVIDER_SITE_OTHER): Payer: Medicaid Other | Admitting: Adult Health

## 2017-02-21 VITALS — BP 126/86 | HR 99 | Wt 195.2 lb

## 2017-02-21 DIAGNOSIS — G43009 Migraine without aura, not intractable, without status migrainosus: Secondary | ICD-10-CM | POA: Diagnosis not present

## 2017-02-21 MED ORDER — DEXAMETHASONE 2 MG PO TABS: ORAL_TABLET | ORAL | 0 refills | Status: DC

## 2017-02-21 NOTE — Patient Instructions (Addendum)
Continue Trokendi 200 mg daily Try decadron to break headache cycle: Day 1: 3 tablets Day 2 : 2 tablets Day 3: 1 tablet Take in the morning If your symptoms worsen or you develop new symptoms please let us know.   Dexamethasone tablets What is this medicine? DEXAMETHASONE (dex a METH a sone) is a corticosteroid. It is commonly used to treat inflammation of the skin, joints, lungs, and other organs. Common conditions treated include asthma, allergies, and arthritis. It is also used for other conditions, such as blood disorders and diseases of the adrenal glands. This medicine may be used for other purposes; ask your health care provider or pharmacist if you have questions. COMMON BRAND NAME(S): CUSHINGS SYN[employer] DIAGNOSTIC, Decadron, DexPak Jr TaperPak, DexPak TaperPak, Zema-Pak, ZoDex, ZonaCort 11 Day, ZonaCort 7 Day What should I tell my health care provider before I take this medicine? They need to know if you have any of these conditions: -Cushing's syndrome -diabetes -glaucoma -heart problems or disease -high blood pressure -infection like herpes, measles, tuberculosis, or chickenpox -kidney disease -liver disease -mental problems -myasthenia gravis -osteoporosis -previous heart attack -seizures -stomach, ulcer or intestine disease including colitis and diverticulitis -thyroid problem -an unusual or allergic reaction to dexamethasone, corticosteroids, other medicines, lactose, foods, dyes, or preservatives -pregnant or trying to get pregnant -breast-feeding How should I use this medicine? Take this medicine by mouth with a drink of water. Follow the directions on the prescription label. Take it with food or milk to avoid stomach upset. If you are taking this medicine once a day, take it in the morning. Do not take more medicine than you are told to take. Do not suddenly stop taking your medicine because you may develop a severe reaction. Your doctor will tell you how much  medicine to take. If your doctor wants you to stop the medicine, the dose may be slowly lowered over time to avoid any side effects. Talk to your pediatrician regarding the use of this medicine in children. Special care may be needed. Patients over 22 years old may have a stronger reaction and need a smaller dose. Overdosage: If you think you have taken too much of this medicine contact a poison control center or emergency room at once. NOTE: This medicine is only for you. Do not share this medicine with others. What if I miss a dose? If you miss a dose, take it as soon as you can. If it is almost time for your next dose, talk to your doctor or health care professional. You may need to miss a dose or take an extra dose. Do not take double or extra doses without advice. What may interact with this medicine? Do not take this medicine with any of the following medications: -mifepristone, RU-486 -vaccines This medicine may also interact with the following medications: -amphotericin B -antibiotics like clarithromycin, erythromycin, and troleandomycin -aspirin and aspirin-like drugs -barbiturates like phenobarbital -carbamazepine -cholestyramine -cholinesterase inhibitors like donepezil, galantamine, rivastigmine, and tacrine -cyclosporine -digoxin -diuretics -ephedrine -female hormones, like estrogens or progestins and birth control pills -indinavir -isoniazid -ketoconazole -medicines for diabetes -medicines that improve muscle tone or strength for conditions like myasthenia gravis -NSAIDs, medicines for pain and inflammation, like ibuprofen or naproxen -phenytoin -rifampin -thalidomide -warfarin This list may not describe all possible interactions. Give your health care provider a list of all the medicines, herbs, non-prescription drugs, or dietary supplements you use. Also tell them if you smoke, drink alcohol, or use illegal drugs. Some items may interact with your medicine.  What  should I watch for while using this medicine? Visit your doctor or health care professional for regular checks on your progress. If you are taking this medicine over a prolonged period, carry an identification card with your name and address, the type and dose of your medicine, and your doctor's name and address. This medicine may increase your risk of getting an infection. Stay away from people who are sick. Tell your doctor or health care professional if you are around anyone with measles or chickenpox. If you are going to have surgery, tell your doctor or health care professional that you have taken this medicine within the last twelve months. Ask your doctor or health care professional about your diet. You may need to lower the amount of salt you eat. The medicine can increase your blood sugar. If you are a diabetic check with your doctor if you need help adjusting the dose of your diabetic medicine. What side effects may I notice from receiving this medicine? Side effects that you should report to your doctor or health care professional as soon as possible: -allergic reactions like skin rash, itching or hives, swelling of the face, lips, or tongue -changes in vision -fever, sore throat, sneezing, cough, or other signs of infection, wounds that will not heal -increased thirst -mental depression, mood swings, mistaken feelings of self importance or of being mistreated -pain in hips, back, ribs, arms, shoulders, or legs -redness, blistering, peeling or loosening of the skin, including inside the mouth -trouble passing urine or change in the amount of urine -swelling of feet or lower legs -unusual bleeding or bruising Side effects that usually do not require medical attention (report to your doctor or health care professional if they continue or are bothersome): -headache -nausea, vomiting -skin problems, acne, thin and shiny skin -weight gain This list may not describe all possible side  effects. Call your doctor for medical advice about side effects. You may report side effects to FDA at 1-800-FDA-1088. Where should I keep my medicine? Keep out of the reach of children. Store at room temperature between 20 and 25 degrees C (68 and 77 degrees F). Protect from light. Throw away any unused medicine after the expiration date. NOTE: This sheet is a summary. It may not cover all possible information. If you have questions about this medicine, talk to your doctor, pharmacist, or health care provider.  2018 Elsevier/Gold Standard (2007-12-13 14:02:13)

## 2017-02-21 NOTE — Progress Notes (Signed)
PATIENT: Sara Hubbard DOB: 02/20/1996  REASON FOR VISIT: follow up- cerebral palsy, migraine headache HISTORY FROM: patient  HISTORY OF PRESENT ILLNESS: Sara Hubbard is a 21 year old female with a history of cerebral palsy and migraine headaches. She returns today for follow-up. She is currently taking trokendi 200 mg at bedtime. She states that she's been under a lot of stress here recently. She is no longer at [facility]. She states her mom has had some issues and the state took away her brother and her mom was put in jail. She is currently trying to get custody of her brother who is 58. She states that she's been having daily headaches since all of this started. Her headaches always occur on the right side. When she does take Maxalt it still takes approximately 1-2 hours for headaches to resolve. She reports that she is not been taking Maxalt daily. She does have nausea with her headaches. She returns today for an evaluation.   HISTORY 08/23/16: Sara Hubbard is a 21 year old right-handed white female with history of cerebral palsy and migraine headache. The patient is having 1 or 2 headaches a month, she is not using any medications to stop the headache once they start, but they may last about an hour and go away. The patient has not had any more syncopal events. She reports that her paternal grandmother and grandfather had a history of seizures. The patient is on Trokendi taking 200 mg in the evening, as long she is on this medication she does well with the headache. She is tolerating the drug well. She returns to this office for an evaluation.  REVIEW OF SYSTEMS: Out of a complete 14 system review of symptoms, the patient complains only of the following symptoms, and all other reviewed systems are negative.   Swollen lymph nodes, headache  ALLERGIES: No Known Allergies  HOME MEDICATIONS: Outpatient Medications Prior to Visit  Medication Sig Dispense Refill  . medroxyPROGESTERone  (DEPO-PROVERA) 150 MG/ML injection Inject 150 mg into the muscle every 3 (three) months.     . rizatriptan (MAXALT-MLT) 10 MG disintegrating tablet TAKE 1 TABLET BY MOUTH THREE TIMES DAILY AS NEEDED FOR MIGRAINE 9 tablet 3  . Topiramate ER (TROKENDI XR) 200 MG CP24 Take 200 mg by mouth at bedtime. 90 capsule 3   No facility-administered medications prior to visit.     PAST MEDICAL HISTORY: Past Medical History:  Diagnosis Date  . Cerebral palsy ([place])   . Common migraine 08/23/2016  . Disturbance of skin sensation 12/30/2013  . Headache(784.0) 12/30/2013  . Migraine   . Pain in limb 12/30/2013  . Syncope     PAST SURGICAL HISTORY: Past Surgical History:  Procedure Laterality Date  . EYE SURGERY    . TOOTH EXTRACTION      FAMILY HISTORY: Family History  Problem Relation Age of Onset  . Hypertension Father   . Diabetes Maternal Grandmother   . Diabetes Paternal Grandmother   . Seizures Paternal Grandmother   . Diabetes Mother   . Seizures Paternal Grandfather     SOCIAL HISTORY: Social History   Social History  . Marital status: Single    Spouse name: N/A  . Number of children: 0  . Years of education: 12th   Occupational History  . Student    Social History Main Topics  . Smoking status: Never Smoker  . Smokeless tobacco: Never Used  . Alcohol use No  . Drug use: No  . Sexual activity: Not Currently  Birth control/ protection: Injection   Other Topics Concern  . Not on file   Social History Narrative   Patient is single with no children.   Patient is right handed.   Patient has college education.   Patient drinks 1 cup daily.      PHYSICAL EXAM  Vitals:   02/21/17 1413  BP: 126/86  Pulse: 99  Weight: 195 lb 3.2 oz (88.5 kg)   Body mass index is 33.51 kg/m.  Generalized: Well developed, in no acute distress  Neck: swollen lymph nodes.   Neurological examination  Mentation: Alert oriented to time, place, history taking. Follows all  commands speech and language fluent Cranial nerve II-XII: Pupils were equal round reactive to light. Extraocular movements were full, visual field were full on confrontational test. Facial sensation and strength were normal. Uvula tongue midline. Head turning and shoulder shrug  were normal and symmetric. Motor: The motor testing reveals 5 over 5 strength of all 4 extremities. Good symmetric motor tone is noted throughout.  Sensory: Sensory testing is intact to soft touch on all 4 extremities. No evidence of extinction is noted.  Coordination: Cerebellar testing reveals good finger-nose-finger and heel-to-shin bilaterally.  Gait and station: Gait is normal. Tandem gait is normal. Romberg is negative. No drift is seen.  Reflexes: Deep tendon reflexes are symmetric and normal bilaterally.   DIAGNOSTIC DATA (LABS, IMAGING, TESTING) - I reviewed patient records, labs, notes, testing and imaging myself where available.  Lab Results  Component Value Date   WBC 15.0 (H) 05/01/2007   HGB 14.6 11/26/2013   HCT 43.0 11/26/2013   MCV 88.4 05/01/2007   PLT 264 05/01/2007      Component Value Date/Time   NA 142 11/26/2013 1442   K 3.5 (L) 11/26/2013 1442   CL 105 11/26/2013 1442   CO2 25 05/01/2007 1700   GLUCOSE 100 (H) 11/26/2013 1442   BUN 13 11/26/2013 1442   CREATININE 0.70 11/26/2013 1442   CALCIUM 9.7 05/01/2007 1700   GFRNONAA NOT CALCULATED 05/01/2007 1700   GFRAA  05/01/2007 1700    NOT CALCULATED        The eGFR has been calculated using the MDRD equation. This calculation has not been validated in all clinical      ASSESSMENT AND PLAN 21 y.o. year old female  has a past medical history of Cerebral palsy ([place]); Common migraine (08/23/2016); Disturbance of skin sensation (12/30/2013); XLKGMWNU(272.5) (12/30/2013); Migraine; Pain in limb (12/30/2013); and Syncope. here with:  1. Migraine headaches  Her headaches have gotten worse that she's had added stress. She will continue on  Trokendi 200 mg daily. I will give her a Decadron Dosepak to break her headache cycle. She is advised that if her headaches do not improve or if they worsen she should let us know. She will follow-up in 6 months or sooner if needed.   Ward Givens, MSN, NP-C 02/21/2017, 1:59 PM [facility] [address] [place], [place] [address] [phone]

## 2017-02-22 NOTE — Progress Notes (Signed)
I have read the note, and I agree with the clinical assessment and plan.  Aadin Gaut A. Matthias Bogus, MD, PhD, FAAN Certified in Neurology, Clinical Neurophysiology, Sleep Medicine, Pain Medicine and Neuroimaging  [facility] [address], [address] [place], [place] [address] [phone]  

## 2017-06-09 ENCOUNTER — Telehealth: Payer: Self-pay | Admitting: *Deleted

## 2017-06-09 NOTE — Telephone Encounter (Signed)
Called [place] [employer].  IR# [id number].  Spoke with Rosanne Ashing.  [place] Approval [id number]  06/09/17 thru 9/30/219.  Trokendi ER  tabs.  ID [id number] Q.  LMVM at [facility] with this information.  They are to call back if needed.

## 2017-08-23 ENCOUNTER — Encounter (INDEPENDENT_AMBULATORY_CARE_PROVIDER_SITE_OTHER): Payer: Self-pay

## 2017-08-23 ENCOUNTER — Encounter: Payer: Self-pay | Admitting: Adult Health

## 2017-08-23 ENCOUNTER — Ambulatory Visit: Payer: Medicaid Other | Admitting: Adult Health

## 2017-08-23 VITALS — BP 119/69 | HR 63 | Ht 64.0 in | Wt 191.6 lb

## 2017-08-23 DIAGNOSIS — G43009 Migraine without aura, not intractable, without status migrainosus: Secondary | ICD-10-CM

## 2017-08-23 MED ORDER — RIZATRIPTAN BENZOATE 10 MG PO TBDP: ORAL_TABLET | ORAL | 3 refills | Status: DC

## 2017-08-23 NOTE — Patient Instructions (Signed)
Your Plan:  Continue Trokendi  If your symptoms worsen or you develop new symptoms please let us know.   Thank you for coming to see [facility] at [facility]. I hope we have been able to provide you high quality care today.  You may receive a patient satisfaction survey over the next few weeks. We would appreciate your feedback and comments so that we may continue to improve ourselves and the health of our patients.

## 2017-08-23 NOTE — Progress Notes (Signed)
PATIENT: Sara Hubbard DOB: [birth year]  REASON FOR VISIT: follow up HISTORY FROM: patient  HISTORY OF PRESENT ILLNESS: Today 08/23/17 Sara Hubbard is a 21 year old female with a history of cerebral palsy and migraine headaches.  She returns today for follow-up.  She remains on Trokendi 200 mg at bedtime.  She reports that her headaches are under relatively good control.  She states that she really has a headache until she is under a lot of stress.  She states that she has a lot of family issues and when different things arise her headaches seem to increase in frequency.  She is currently happy with Trokendi.  She is also on Maxalt and feels that it works well for her acute headache.  She states that she had an event last Friday where she started her menstrual cycle and was very shaky.  She reports that this lasted for about 30 minutes.  She states that she felt like she cannot lift her head off the pillow.  She does not feel that she had any type of seizure event.  Reports that she has not had any additional events since then.  She returns today for an evaluation.  HISTORY February 21, 2017: Sara Hubbard is a 21 year old female with a history of cerebral palsy and migraine headaches. She returns today for follow-up. She is currently taking trokendi 200 mg at bedtime. She states that she's been under a lot of stress here recently. She is no longer at [facility]. She states her mom has had some issues and the state took away her brother and her mom was put in jail. She is currently trying to get custody of her brother who is 26. She states that she's been having daily headaches since all of this started. Her headaches always occur on the right side. When she does take Maxalt it still takes approximately 1-2 hours for headaches to resolve. She reports that she is not been taking Maxalt daily. She does have nausea with her headaches. She returns today for an evaluation.  REVIEW OF SYSTEMS: Out of a complete  14 system review of symptoms, the patient complains only of the following symptoms, and all other reviewed systems are negative.  See HPI  ALLERGIES: No Known Allergies  HOME MEDICATIONS: Outpatient Medications Prior to Visit  Medication Sig Dispense Refill  . dexamethasone (DECADRON) 2 MG tablet Take 3 tablets PO the first day, then 2 tablets the 2nd day, then 1 tablet the 3rd day 6 tablet 0  . medroxyPROGESTERone (DEPO-PROVERA) 150 MG/ML injection Inject 150 mg into the muscle every 3 (three) months.     . rizatriptan (MAXALT-MLT) 10 MG disintegrating tablet TAKE 1 TABLET BY MOUTH THREE TIMES DAILY AS NEEDED FOR MIGRAINE 9 tablet 3  . Topiramate ER (TROKENDI XR) 200 MG CP24 Take 200 mg by mouth at bedtime. 90 capsule 3   No facility-administered medications prior to visit.     PAST MEDICAL HISTORY: Past Medical History:  Diagnosis Date  . Cerebral palsy ([place])   . Common migraine 08/23/2016  . Disturbance of skin sensation 12/30/2013  . Headache(784.0) 12/30/2013  . Migraine   . Pain in limb 12/30/2013  . Syncope     PAST SURGICAL HISTORY: Past Surgical History:  Procedure Laterality Date  . EYE SURGERY    . TOOTH EXTRACTION      FAMILY HISTORY: Family History  Problem Relation Age of Onset  . Hypertension Father   . Diabetes Maternal Grandmother   .  Diabetes Paternal Grandmother   . Seizures Paternal Grandmother   . Diabetes Mother   . Seizures Paternal Grandfather     SOCIAL HISTORY: Social History   Socioeconomic History  . Marital status: Single    Spouse name: Not on file  . Number of children: 0  . Years of education: 12th  . Highest education level: Not on file  Social Needs  . Financial resource strain: Not on file  . Food insecurity - worry: Not on file  . Food insecurity - inability: Not on file  . Transportation needs - medical: Not on file  . Transportation needs - non-medical: Not on file  Occupational History  . Occupation: Ship broker  Tobacco  Use  . Smoking status: Never Smoker  . Smokeless tobacco: Never Used  Substance and Sexual Activity  . Alcohol use: No    Alcohol/week: 0.0 oz  . Drug use: No  . Sexual activity: Not Currently    Birth control/protection: Injection  Other Topics Concern  . Not on file  Social History Narrative   Patient is single with no children.   Patient is right handed.   Patient has college education.   Patient drinks 1 cup daily.      PHYSICAL EXAM  Vitals:   08/23/17 1319  BP: 119/69  Pulse: 63  Weight: 191 lb 9.6 oz (86.9 kg)  Height: '5\' 4"'$  (1.626 m)   Body mass index is 32.89 kg/m.  Generalized: Well developed, in no acute distress   Neurological examination  Mentation: Alert oriented to time, place, history taking. Follows all commands speech and language fluent Cranial nerve II-XII: Pupils were equal round reactive to light. Extraocular movements were full, visual field were full on confrontational test. Facial sensation and strength were normal. Uvula tongue midline. Head turning and shoulder shrug  were normal and symmetric. Motor: The motor testing reveals 5 over 5 strength of all 4 extremities. Good symmetric motor tone is noted throughout.  Sensory: Sensory testing is intact to soft touch on all 4 extremities. No evidence of extinction is noted.  Coordination: Cerebellar testing reveals good finger-nose-finger and heel-to-shin bilaterally.  Gait and station: Gait is normal.  Reflexes: Deep tendon reflexes are symmetric and normal bilaterally.   DIAGNOSTIC DATA (LABS, IMAGING, TESTING) - I reviewed patient records, labs, notes, testing and imaging myself where available.  Lab Results  Component Value Date   WBC 15.0 (H) 05/01/2007   HGB 14.6 11/26/2013   HCT 43.0 11/26/2013   MCV 88.4 05/01/2007   PLT 264 05/01/2007      Component Value Date/Time   NA 142 11/26/2013 1442   K 3.5 (L) 11/26/2013 1442   CL 105 11/26/2013 1442   CO2 25 05/01/2007 1700   GLUCOSE  100 (H) 11/26/2013 1442   BUN 13 11/26/2013 1442   CREATININE 0.70 11/26/2013 1442   CALCIUM 9.7 05/01/2007 1700   GFRNONAA NOT CALCULATED 05/01/2007 1700   GFRAA  05/01/2007 1700    NOT CALCULATED        The eGFR has been calculated using the MDRD equation. This calculation has not been validated in all clinical     ASSESSMENT AND PLAN 21 y.o. year old female  has a past medical history of Cerebral palsy ([place]), Common migraine (08/23/2016), Disturbance of skin sensation (12/30/2013), Headache(784.0) (12/30/2013), Migraine, Pain in limb (12/30/2013), and Syncope. here with:  1.  Migraine headaches  Overall the patient is doing well.  She will continue on Trokendi 200 mg daily.  Advised that if she has any additional events or she experiences shakiness and weakness she should let us know.  If her headache frequency increases she should also make [place] aware.  She will follow-up in 6 months or sooner if needed.  I spent 15 minutes with the patient. 50% of this time was spent discussing her medication and recent event.     Ward Givens, MSN, NP-C 08/23/2017, 1:20 PM [facility] [address] [place], [place] [address] [phone]

## 2017-08-23 NOTE — Progress Notes (Signed)
I have read the note, and I agree with the clinical assessment and plan.  Charles K Willis   

## 2018-02-02 ENCOUNTER — Other Ambulatory Visit: Payer: Self-pay | Admitting: Neurology

## 2018-02-26 ENCOUNTER — Ambulatory Visit: Payer: Medicaid Other | Admitting: Adult Health

## 2018-02-26 ENCOUNTER — Encounter: Payer: Self-pay | Admitting: Adult Health

## 2018-02-26 VITALS — BP 115/78 | HR 102 | Ht 64.0 in | Wt 196.4 lb

## 2018-02-26 DIAGNOSIS — G43009 Migraine without aura, not intractable, without status migrainosus: Secondary | ICD-10-CM

## 2018-02-26 MED ORDER — TOPIRAMATE ER 200 MG PO CAP24: 1.0000 | ORAL_CAPSULE | Freq: Every day | ORAL | 3 refills | Status: DC

## 2018-02-26 NOTE — Progress Notes (Signed)
PATIENT: Tyrihanna Wingert DOB: 10-10-1995  REASON FOR VISIT: follow up HISTORY FROM: patient  HISTORY OF PRESENT ILLNESS: Today 02/26/18:  Ms. Gerety Is a 22 year old female with a history of cerebral palsy and migraine headaches.  Reports that she is doing well.  He has approximately 1 migraine a month.  She remains on Trokendi 200 mg daily.  She reports that she continues to take Maxalt with good benefit.  She is currently taking Depo shot for birth control.  She states that she is considering other birth control method.  She reports that she did run out of Trokendi for several days.  She reports that she did have some breakthrough headaches during that time.  She returns today for follow-up.   HISTORY 08/23/17 Ms. Balow is a 22 year old female with a history of cerebral palsy and migraine headaches.  She returns today for follow-up.  She remains on Trokendi 200 mg at bedtime.  She reports that her headaches are under relatively good control.  She states that she really has a headache until she is under a lot of stress.  She states that she has a lot of family issues and when different things arise her headaches seem to increase in frequency.  She is currently happy with Trokendi.  She is also on Maxalt and feels that it works well for her acute headache.  She states that she had an event last Friday where she started her menstrual cycle and was very shaky.  She reports that this lasted for about 30 minutes.  She states that she felt like she cannot lift her head off the pillow.  She does not feel that she had any type of seizure event.  Reports that she has not had any additional events since then.  She returns today for an evaluation.   REVIEW OF SYSTEMS: Out of a complete 14 system review of symptoms, the patient complains only of the following symptoms, and all other reviewed systems are negative.  See HPI  ALLERGIES: No Known Allergies  HOME MEDICATIONS: Outpatient Medications Prior to  Visit  Medication Sig Dispense Refill  . medroxyPROGESTERone (DEPO-PROVERA) 150 MG/ML injection Inject 150 mg into the muscle every 3 (three) months.     . rizatriptan (MAXALT-MLT) 10 MG disintegrating tablet TAKE 1 TABLET BY MOUTH THREE TIMES DAILY AS NEEDED FOR MIGRAINE 9 tablet 3  . TROKENDI XR 200 MG CP24 TAKE ONE CAPSULE BY MOUTH EVERY DAY 90 capsule 0  . dexamethasone (DECADRON) 2 MG tablet Take 3 tablets PO the first day, then 2 tablets the 2nd day, then 1 tablet the 3rd day 6 tablet 0   No facility-administered medications prior to visit.     PAST MEDICAL HISTORY: Past Medical History:  Diagnosis Date  . Cerebral palsy ([place])   . Common migraine 08/23/2016  . Disturbance of skin sensation 12/30/2013  . Headache(784.0) 12/30/2013  . Migraine   . Pain in limb 12/30/2013  . Syncope     PAST SURGICAL HISTORY: Past Surgical History:  Procedure Laterality Date  . EYE SURGERY    . TOOTH EXTRACTION      FAMILY HISTORY: Family History  Problem Relation Age of Onset  . Hypertension Father   . Diabetes Maternal Grandmother   . Diabetes Paternal Grandmother   . Seizures Paternal Grandmother   . Diabetes Mother   . Seizures Paternal Grandfather     SOCIAL HISTORY: Social History   Socioeconomic History  . Marital status: Single  Spouse name: Not on file  . Number of children: 0  . Years of education: 12th  . Highest education level: Not on file  Occupational History  . Occupation: Ship broker  Social Needs  . Financial resource strain: Not on file  . Food insecurity:    Worry: Not on file    Inability: Not on file  . Transportation needs:    Medical: Not on file    Non-medical: Not on file  Tobacco Use  . Smoking status: Never Smoker  . Smokeless tobacco: Never Used  Substance and Sexual Activity  . Alcohol use: No    Alcohol/week: 0.0 oz  . Drug use: No  . Sexual activity: Not Currently    Birth control/protection: Injection  Lifestyle  . Physical activity:      Days per week: Not on file    Minutes per session: Not on file  . Stress: Not on file  Relationships  . Social connections:    Talks on phone: Not on file    Gets together: Not on file    Attends religious service: Not on file    Active member of club or organization: Not on file    Attends meetings of clubs or organizations: Not on file    Relationship status: Not on file  . Intimate partner violence:    Fear of current or ex partner: Not on file    Emotionally abused: Not on file    Physically abused: Not on file    Forced sexual activity: Not on file  Other Topics Concern  . Not on file  Social History Narrative   Patient is single with no children.   Patient is right handed.   Patient has college education.   Patient drinks 1 cup daily.      PHYSICAL EXAM  Vitals:   02/26/18 1304  BP: 115/78  Pulse: (!) 102  Weight: 196 lb 6.4 oz (89.1 kg)  Height: '5\' 4"'$  (1.626 m)   Body mass index is 33.71 kg/m.  Generalized: Well developed, in no acute distress   Neurological examination  Mentation: Alert oriented to time, place, history taking. Follows all commands speech and language fluent Cranial nerve II-XII: Pupils were equal round reactive to light. Extraocular movements were full, visual field were full on confrontational test. Facial sensation and strength were normal. Uvula tongue midline. Head turning and shoulder shrug  were normal and symmetric. Motor: The motor testing reveals 5 over 5 strength of all 4 extremities. Good symmetric motor tone is noted throughout.  Sensory: Sensory testing is intact to soft touch on all 4 extremities. No evidence of extinction is noted.  Coordination: Cerebellar testing reveals good finger-nose-finger and heel-to-shin bilaterally.  Gait and station: Gait is normal. Tandem gait is unsteady.. Romberg is negative. No drift is seen.  Reflexes: Deep tendon reflexes are symmetric and normal bilaterally.   DIAGNOSTIC DATA (LABS,  IMAGING, TESTING) - I reviewed patient records, labs, notes, testing and imaging myself where available.  Lab Results  Component Value Date   WBC 15.0 (H) 05/01/2007   HGB 14.6 11/26/2013   HCT 43.0 11/26/2013   MCV 88.4 05/01/2007   PLT 264 05/01/2007      Component Value Date/Time   NA 142 11/26/2013 1442   K 3.5 (L) 11/26/2013 1442   CL 105 11/26/2013 1442   CO2 25 05/01/2007 1700   GLUCOSE 100 (H) 11/26/2013 1442   BUN 13 11/26/2013 1442   CREATININE 0.70 11/26/2013 1442  CALCIUM 9.7 05/01/2007 1700   GFRNONAA NOT CALCULATED 05/01/2007 1700   GFRAA  05/01/2007 1700    NOT CALCULATED        The eGFR has been calculated using the MDRD equation. This calculation has not been validated in all clinical      ASSESSMENT AND PLAN 21 y.o. year old female  has a past medical history of Cerebral palsy ([place]), Common migraine (08/23/2016), Disturbance of skin sensation (12/30/2013), Headache(784.0) (12/30/2013), Migraine, Pain in limb (12/30/2013), and Syncope. here with:  1.  Migraine headache  Overall patient is doing well.  She will continue on Trokendi 200 mg daily.  I have advised that Trokendi is not recommended in pregnancy.  I did recommend that she use an effective birth control method while taking his medication.  If she decides to come off her birth control she will let [place] know.  She will follow-up in 6 months or sooner if needed.  I spent 15 minutes with the patient. 50% of this time was spent discussing her migraine headaches   Ward Givens, MSN, NP-C 02/26/2018, 1:41 PM [facility] [address] [place], [place] [address] [phone]

## 2018-02-26 NOTE — Patient Instructions (Signed)
Your Plan:  Continue Trokendi  If your symptoms worsen or you develop new symptoms please let us know.   Thank you for coming to see us at [facility]. I hope we have been able to provide you high quality care today.  You may receive a patient satisfaction survey over the next few weeks. We would appreciate your feedback and comments so that we may continue to improve ourselves and the health of our patients.  

## 2018-02-26 NOTE — Progress Notes (Signed)
I have read the note, and I agree with the clinical assessment and plan.  Jeffie Widdowson K Tanda Morrissey   

## 2018-05-30 ENCOUNTER — Emergency Department (HOSPITAL_COMMUNITY): Payer: Medicaid Other

## 2018-05-30 ENCOUNTER — Observation Stay (HOSPITAL_COMMUNITY)
Admission: EM | Admit: 2018-05-30 | Discharge: 2018-06-01 | Disposition: A | Discharge status: Home / Self Care | Payer: Medicaid Other | Attending: Internal Medicine | Admitting: Internal Medicine

## 2018-05-30 ENCOUNTER — Other Ambulatory Visit: Payer: Self-pay

## 2018-05-30 ENCOUNTER — Encounter (HOSPITAL_COMMUNITY): Payer: Self-pay | Admitting: *Deleted

## 2018-05-30 DIAGNOSIS — Z79899 Other long term (current) drug therapy: Secondary | ICD-10-CM | POA: Insufficient documentation

## 2018-05-30 DIAGNOSIS — G459 Transient cerebral ischemic attack, unspecified: Secondary | ICD-10-CM | POA: Diagnosis present

## 2018-05-30 DIAGNOSIS — G43909 Migraine, unspecified, not intractable, without status migrainosus: Secondary | ICD-10-CM | POA: Diagnosis not present

## 2018-05-30 DIAGNOSIS — G809 Cerebral palsy, unspecified: Secondary | ICD-10-CM | POA: Diagnosis not present

## 2018-05-30 DIAGNOSIS — E876 Hypokalemia: Principal | ICD-10-CM

## 2018-05-30 DIAGNOSIS — R531 Weakness: Secondary | ICD-10-CM

## 2018-05-30 DIAGNOSIS — R4702 Dysphasia: Secondary | ICD-10-CM

## 2018-05-30 DIAGNOSIS — G43109 Migraine with aura, not intractable, without status migrainosus: Secondary | ICD-10-CM

## 2018-05-30 DIAGNOSIS — R4789 Other speech disturbances: Secondary | ICD-10-CM | POA: Diagnosis not present

## 2018-05-30 DIAGNOSIS — R209 Unspecified disturbances of skin sensation: Secondary | ICD-10-CM

## 2018-05-30 DIAGNOSIS — G43009 Migraine without aura, not intractable, without status migrainosus: Secondary | ICD-10-CM | POA: Diagnosis present

## 2018-05-30 LAB — COMPREHENSIVE METABOLIC PANEL
ALBUMIN: 4.1 g/dL (ref 3.5–5.0)
ALK PHOS: 57 U/L (ref 38–126)
ALT: 10 U/L (ref 0–44)
AST: 13 U/L — AB (ref 15–41)
Anion gap: 8 (ref 5–15)
BUN: 10 mg/dL (ref 6–20)
CALCIUM: 9.2 mg/dL (ref 8.9–10.3)
CHLORIDE: 112 mmol/L — AB (ref 98–111)
CO2: 19 mmol/L — ABNORMAL LOW (ref 22–32)
CREATININE: 0.71 mg/dL (ref 0.44–1.00)
GFR calc Af Amer: >60 mL/min (ref >60)
GFR calc non Af Amer: >60 mL/min (ref >60)
GLUCOSE: 91 mg/dL (ref 70–99)
Potassium: 3.2 mmol/L — ABNORMAL LOW (ref 3.5–5.1)
SODIUM: 139 mmol/L (ref 135–145)
Total Bilirubin: 0.5 mg/dL (ref 0.3–1.2)
Total Protein: 7.1 g/dL (ref 6.5–8.1)

## 2018-05-30 LAB — DIFFERENTIAL
BASOS ABS: 0 10*3/uL (ref 0.0–0.1)
Basophils Relative: 0 %
EOS ABS: 0.1 10*3/uL (ref 0.0–0.7)
EOS PCT: 1 %
Lymphocytes Relative: 28 %
Lymphs Abs: 3.3 10*3/uL (ref 0.7–4.0)
MONOS PCT: 6 %
Monocytes Absolute: 0.7 10*3/uL (ref 0.1–1.0)
Neutro Abs: 7.9 10*3/uL — ABNORMAL HIGH (ref 1.7–7.7)
Neutrophils Relative %: 65 %

## 2018-05-30 LAB — CBC WITH DIFFERENTIAL/PLATELET
BASOS ABS: 0 10*3/uL (ref 0.0–0.1)
BASOS PCT: 0 %
EOS ABS: 0.1 10*3/uL (ref 0.0–0.7)
EOS PCT: 0 %
HCT: 41.2 % (ref 36.0–46.0)
HEMOGLOBIN: 14.2 g/dL (ref 12.0–15.0)
Lymphocytes Relative: 27 %
Lymphs Abs: 3.4 10*3/uL (ref 0.7–4.0)
MCH: 31.4 pg (ref 26.0–34.0)
MCHC: 34.5 g/dL (ref 30.0–36.0)
MCV: 91.2 fL (ref 78.0–100.0)
Monocytes Absolute: 0.7 10*3/uL (ref 0.1–1.0)
Monocytes Relative: 6 %
NEUTROS PCT: 67 %
Neutro Abs: 8.2 10*3/uL — ABNORMAL HIGH (ref 1.7–7.7)
PLATELETS: 241 10*3/uL (ref 150–400)
RBC: 4.52 MIL/uL (ref 3.87–5.11)
RDW: 12.6 % (ref 11.5–15.5)
WBC: 12.3 10*3/uL — AB (ref 4.0–10.5)

## 2018-05-30 LAB — PROTIME-INR
INR: 0.98
Prothrombin Time: 12.9 seconds (ref 11.4–15.2)

## 2018-05-30 LAB — APTT: aPTT: 34 seconds (ref 24–36)

## 2018-05-30 MED ORDER — IOPAMIDOL (ISOVUE-370) INJECTION 76%
100.0000 mL | Freq: Once | INTRAVENOUS | Status: AC | PRN
  Administered 2018-05-31: 100 mL via INTRAVENOUS

## 2018-05-30 NOTE — ED Notes (Signed)
SPOK paged @ [phone]

## 2018-05-30 NOTE — ED Provider Notes (Signed)
[facility] EMERGENCY DEPARTMENT Provider Note   CSN: [id number] Arrival date & time: 05/30/18  2037     History   Chief Complaint Chief Complaint  Patient presents with  . Altered Mental Status    HPI Sara Hubbard is a 22 y.o. female.  Patient with history of migraine headaches, questionable seizures and cerebral palsy presenting with right-sided numbness and weakness.  Symptoms started around 6 PM.  She arrived to the ED at 2037.  Code stroke activated on my initial evaluation at 2340.  Patient states she was at work when she began to feel lightheaded and hot and sweaty in her hands and feet.  She felt like she was going to pass out so she sat down.  She felt that she may be having a seizure.  Before that she had a mild headache that was dissimilar to her previous migraines.  Her headache progressively worsened.  She denies thunderclap onset.  She has some difficulty speaking and could not speak initially when her friend arrived.  She now has numbness and weakness in her right arm and right leg which is progressively improving.  She is never had any weakness or numbness with migraine headaches before.  No fevers, chills, nausea or vomiting.  Denies any photophobia.  She has right-sided chest pain is worse with palpation.  She continues to complain of numbness to her right arm and right leg.  Her father at bedside says her speech was off when he first arrived with seems to be improving now.  The history is provided by the patient and a relative.  Altered Mental Status   Associated symptoms include weakness.    Past Medical History:  Diagnosis Date  . Cerebral palsy (HCC)   . Common migraine 08/23/2016  . Disturbance of skin sensation 12/30/2013  . Headache(784.0) 12/30/2013  . Migraine   . Pain in limb 12/30/2013  . Syncope     Patient Active Problem List   Diagnosis Date Noted  . Common migraine 08/23/2016  . Right sided abdominal pain 09/08/2014  . Pain in limb 12/30/2013  .  Disturbance of skin sensation 12/30/2013  . Syncope and collapse 12/30/2013  . Headache 12/30/2013    Past Surgical History:  Procedure Laterality Date  . EYE SURGERY    . TOOTH EXTRACTION       OB History   None      Home Medications    Prior to Admission medications   Medication Sig Start Date End Date Taking? Authorizing Provider  medroxyPROGESTERone (DEPO-PROVERA) 150 MG/ML injection Inject 150 mg into the muscle every 3 (three) months.    Yes [provider]  rizatriptan (MAXALT-MLT) 10 MG disintegrating tablet TAKE 1 TABLET BY MOUTH THREE TIMES DAILY AS NEEDED FOR MIGRAINE Patient taking differently: Take 10 mg by mouth 3 (three) times daily as needed for migraine.  08/23/17  Yes Butch Penny, NP  Topiramate ER (TROKENDI XR) 200 MG CP24 Take 1 capsule by mouth daily. 02/26/18  Yes Butch Penny, NP    Family History Family History  Problem Relation Age of Onset  . Hypertension Father   . Diabetes Maternal Grandmother   . Diabetes Paternal Grandmother   . Seizures Paternal Grandmother   . Diabetes Mother   . Seizures Paternal Grandfather     Social History Social History   Tobacco Use  . Smoking status: Never Smoker  . Smokeless tobacco: Never Used  Substance Use Topics  . Alcohol use: No    Alcohol/week:  0.0 standard drinks  . Drug use: No     Allergies   Patient has no known allergies.   Review of Systems Review of Systems  Constitutional: Negative for activity change, appetite change and fever.  HENT: Negative for congestion and rhinorrhea.   Eyes: Negative for visual disturbance.  Respiratory: Positive for chest tightness. Negative for cough and shortness of breath.   Cardiovascular: Negative for chest pain.  Gastrointestinal: Negative for abdominal pain, nausea and vomiting.  Endocrine: Negative for polyuria.  Genitourinary: Negative for dysuria, vaginal bleeding and vaginal discharge.  Musculoskeletal: Negative for arthralgias  and myalgias.  Neurological: Positive for dizziness, weakness, numbness and headaches.   all other systems are negative except as noted in the HPI and PMH.     Physical Exam Updated Vital Signs BP 116/81 (BP Location: Left Arm)   Pulse 85   Temp 99.1 F (37.3 C) (Oral)   Resp 16   Ht 5\' 4"  (1.626 m)   SpO2 100%   BMI 33.71 kg/m   Physical Exam  Constitutional: She is oriented to person, place, and time. She appears well-developed and well-nourished. No distress.  Some hesitancy with speech.  Questionable expressive aphasia  HENT:  Head: Normocephalic and atraumatic.  Mouth/Throat: Oropharynx is clear and moist. No oropharyngeal exudate.  Eyes: Pupils are equal, round, and reactive to light. Conjunctivae and EOM are normal.  R eye strabismus, baseline per family  Neck: Normal range of motion. Neck supple.  No meningismus.  Cardiovascular: Normal rate, regular rhythm, normal heart sounds and intact distal pulses.  No murmur heard. Pulmonary/Chest: Effort normal and breath sounds normal. No respiratory distress. She exhibits tenderness.  Reproducible chest wall tenderness  Abdominal: Soft. There is no tenderness. There is no rebound and no guarding.  Musculoskeletal: Normal range of motion. She exhibits no edema or tenderness.  Neurological: She is alert and oriented to person, place, and time. No cranial nerve deficit. She exhibits normal muscle tone. Coordination normal.  Cranial nerves II to XII are intact, symmetric smile, tongue is midline.  Decreased grip strength on the right.  Difficulty holding of right arm against gravity with pronator drift.  3/5 strength of right arm compared to 5/5 strength of left arm.  Able to hold right leg off the bed with encouragement and 5/5 strength of lower extremities bilaterally.  No ataxia in finger-to-nose bilaterally Subjective decreased sensation to right arm and right leg.  Skin: Skin is warm.  Psychiatric: She has a normal mood and  affect. Her behavior is normal.  Nursing note and vitals reviewed.    ED Treatments / Results  Labs (all labs ordered are listed, but only abnormal results are displayed) Labs Reviewed  CBC WITH DIFFERENTIAL/PLATELET - Abnormal; Notable for the following components:      Result Value   WBC 12.3 (*)    Neutro Abs 8.2 (*)    All other components within normal limits  COMPREHENSIVE METABOLIC PANEL - Abnormal; Notable for the following components:   Potassium 3.2 (*)    Chloride 112 (*)    CO2 19 (*)    AST 13 (*)    All other components within normal limits  ACETAMINOPHEN LEVEL - Abnormal; Notable for the following components:   Acetaminophen (Tylenol), Serum <10 (*)    All other components within normal limits  DIFFERENTIAL - Abnormal; Notable for the following components:   Neutro Abs 7.9 (*)    All other components within normal limits  ETHANOL  SALICYLATE LEVEL  RAPID URINE DRUG SCREEN, HOSP PERFORMED  URINALYSIS, ROUTINE W REFLEX MICROSCOPIC  PROTIME-INR  APTT  TROPONIN I  HCG, QUANTITATIVE, PREGNANCY    EKG EKG Interpretation  Date/Time:  Thursday May 31 2018 01:05:41 EDT Ventricular Rate:  81 PR Interval:    QRS Duration: 92 QT Interval:  377 QTC Calculation: 438 R Axis:   68 Text Interpretation:  Sinus rhythm Borderline T abnormalities, anterior leads No significant change was found Confirmed by Glynn Octave [phone]) on 05/31/2018 1:19:12 AM   Radiology Ct Angio Head W Or Wo Contrast  Result Date: 05/31/2018 CLINICAL DATA:  Initial evaluation for acute headache with transient speech impairment. EXAM: CT ANGIOGRAPHY HEAD AND NECK TECHNIQUE: Multidetector CT imaging of the head and neck was performed using the standard protocol during bolus administration of intravenous contrast. Multiplanar CT image reconstructions and MIPs were obtained to evaluate the vascular anatomy. Carotid stenosis measurements (when applicable) are obtained utilizing NASCET  criteria, using the distal internal carotid diameter as the denominator. CONTRAST:  ISOVUE-370 IOPAMIDOL (ISOVUE-370) INJECTION 76% COMPARISON:  Prior CT from earlier the same day. FINDINGS: CTA NECK FINDINGS Aortic arch: Visualized aortic arch of normal caliber with normal branch pattern. No flow-limiting stenosis about the origin of the great vessels. Visualized subclavian arteries widely patent. Right carotid system: Right common and internal carotid arteries widely patent without stenosis, dissection, or occlusion. Left carotid system: Left common and internal carotid arteries widely patent without stenosis, dissection, or occlusion. Vertebral arteries: Both vertebral arteries arise from the subclavian arteries. Pre foraminal left V1 segment not well assessed due to adjacent venous contamination. Vertebral arteries otherwise widely patent without stenosis, dissection, or occlusion. Skeleton: No acute osseus abnormality. No discrete lytic or blastic osseous lesions. Other neck: No other acute soft tissue abnormality within the neck. Upper chest: Visualized upper chest within normal limits. Review of the MIP images confirms the above findings CTA HEAD FINDINGS Anterior circulation: Internal carotid arteries widely patent to the termini without stenosis. A1 segments patent bilaterally. Left A1 hypoplastic. Normal anterior communicating artery. Anterior cerebral arteries widely patent to their distal aspects. M1 segments widely patent without stenosis. Normal MCA bifurcations. No proximal M2 occlusion. Distal MCA branches well perfused and symmetric. Posterior circulation: Vertebral arteries widely patent to the vertebrobasilar junction. Basilar artery widely patent to its distal aspect. Superior cerebral arteries patent bilaterally. Fetal type origin of the left PCA supplied via a prominent and widely patent left posterior communicating artery. Hypoplastic right P1 with prominent right posterior communicating  artery as well. PCAs widely patent to their distal aspects. Venous sinuses: Patent. Anatomic variants: Fetal type origin of the left PCA. Hypoplastic left P1. No intracranial aneurysm or other vascular abnormality. Delayed phase: No pathologic enhancement. Review of the MIP images confirms the above findings IMPRESSION: Normal CTA of the head and neck. No large vessel occlusion. No hemodynamically significant or correctable stenosis. Electronically Signed   By: Rise Mu M.D.   On: 05/31/2018 01:34   Ct Angio Neck W And/or Wo Contrast  Result Date: 05/31/2018 CLINICAL DATA:  Initial evaluation for acute headache with transient speech impairment. EXAM: CT ANGIOGRAPHY HEAD AND NECK TECHNIQUE: Multidetector CT imaging of the head and neck was performed using the standard protocol during bolus administration of intravenous contrast. Multiplanar CT image reconstructions and MIPs were obtained to evaluate the vascular anatomy. Carotid stenosis measurements (when applicable) are obtained utilizing NASCET criteria, using the distal internal carotid diameter as the denominator. CONTRAST:  ISOVUE-370 IOPAMIDOL (ISOVUE-370) INJECTION 76%  COMPARISON:  Prior CT from earlier the same day. FINDINGS: CTA NECK FINDINGS Aortic arch: Visualized aortic arch of normal caliber with normal branch pattern. No flow-limiting stenosis about the origin of the great vessels. Visualized subclavian arteries widely patent. Right carotid system: Right common and internal carotid arteries widely patent without stenosis, dissection, or occlusion. Left carotid system: Left common and internal carotid arteries widely patent without stenosis, dissection, or occlusion. Vertebral arteries: Both vertebral arteries arise from the subclavian arteries. Pre foraminal left V1 segment not well assessed due to adjacent venous contamination. Vertebral arteries otherwise widely patent without stenosis, dissection, or occlusion. Skeleton: No  acute osseus abnormality. No discrete lytic or blastic osseous lesions. Other neck: No other acute soft tissue abnormality within the neck. Upper chest: Visualized upper chest within normal limits. Review of the MIP images confirms the above findings CTA HEAD FINDINGS Anterior circulation: Internal carotid arteries widely patent to the termini without stenosis. A1 segments patent bilaterally. Left A1 hypoplastic. Normal anterior communicating artery. Anterior cerebral arteries widely patent to their distal aspects. M1 segments widely patent without stenosis. Normal MCA bifurcations. No proximal M2 occlusion. Distal MCA branches well perfused and symmetric. Posterior circulation: Vertebral arteries widely patent to the vertebrobasilar junction. Basilar artery widely patent to its distal aspect. Superior cerebral arteries patent bilaterally. Fetal type origin of the left PCA supplied via a prominent and widely patent left posterior communicating artery. Hypoplastic right P1 with prominent right posterior communicating artery as well. PCAs widely patent to their distal aspects. Venous sinuses: Patent. Anatomic variants: Fetal type origin of the left PCA. Hypoplastic left P1. No intracranial aneurysm or other vascular abnormality. Delayed phase: No pathologic enhancement. Review of the MIP images confirms the above findings IMPRESSION: Normal CTA of the head and neck. No large vessel occlusion. No hemodynamically significant or correctable stenosis. Electronically Signed   By: Rise Mu M.D.   On: 05/31/2018 01:34   Ct Head Code Stroke Wo Contrast  Result Date: 05/31/2018 CLINICAL DATA:  Code stroke. Initial evaluation for acute altered mental status, aphasia. EXAM: CT HEAD WITHOUT CONTRAST TECHNIQUE: Contiguous axial images were obtained from the base of the skull through the vertex without intravenous contrast. COMPARISON:  None. FINDINGS: Brain: Cerebral volume within normal limits for patient age. No  evidence for acute intracranial hemorrhage. No findings to suggest acute large vessel territory infarct. No mass lesion, midline shift, or mass effect. Ventricles are normal in size without evidence for hydrocephalus. No extra-axial fluid collection identified. Vascular: No hyperdense vessel identified. Skull: Scalp soft tissues demonstrate no acute abnormality. Calvarium intact. Sinuses/Orbits: Globes and orbital soft tissues within normal limits. Air-fluid level noted within the right sphenoid sinus. Paranasal sinuses are otherwise clear. No mastoid effusion. ASPECTS [facility] Stroke Program Early CT Score) - Ganglionic level infarction (caudate, lentiform nuclei, internal capsule, insula, M1-M3 cortex): 7 - Supraganglionic infarction (M4-M6 cortex): 3 Total score (0-10 with 10 being normal): 10 IMPRESSION: 1. No acute intracranial abnormality. 2. ASPECTS is 10. 3. Acute right sphenoid sinusitis. Critical Value/emergent results were called by telephone at the time of interpretation on 05/31/2018 at 12:01 am to Dr. Glynn Octave , who verbally acknowledged these results. Electronically Signed   By: Rise Mu M.D.   On: 05/31/2018 00:03    Procedures Procedures (including critical care time)  Medications Ordered in ED Medications - No data to display   Initial Impression / Assessment and Plan / ED Course  I have reviewed the triage vital signs and the nursing notes.  Pertinent labs & imaging results that were available during my care of the patient were reviewed by me and considered in my medical decision making (see chart for details).    Patient with history of migraines and seizure disorder presenting with near syncope associated right-sided numbness and weakness and some difficulty speaking which seems to be improving. She still has persistent right-sided weakness on initial exam and code stroke was activated.  Last seen normal was 6 PM.  CT head negative. Patient seen by neurology  dr. Lyndee Leo.  "Impression:  headache and transient speech impairment, concerning for seizure vs TIA vs systemic cause such as hypotension/arrhythmia vs complicated migraine"  Patient not TPA candidate due to delay in presentation.  Large vessel occlusion not suspected.  CT is negative.  Patient with improving right-sided weakness and numbness.  Family states her speech is normal.  Will give migraine cocktail for suspicion of possible complicated migraine versus postictal Todd's paralysis.  CTA is negative for large vessel occlusion.   Will admit for stroke work-up per neurology recommendations. Discussed with Dr. Laural Benes  CRITICAL CARE Performed by: Glynn Octave Total critical care time: 35 minutes Critical care time was exclusive of separately billable procedures and treating other patients. Critical care was necessary to treat or prevent imminent or life-threatening deterioration. Critical care was time spent personally by me on the following activities: development of treatment plan with patient and/or surrogate as well as nursing, discussions with consultants, evaluation of patient's response to treatment, examination of patient, obtaining history from patient or surrogate, ordering and performing treatments and interventions, ordering and review of laboratory studies, ordering and review of radiographic studies, pulse oximetry and re-evaluation of patient's condition.   Final Clinical Impressions(s) / ED Diagnoses   Final diagnoses:  Right sided weakness  Complicated migraine    ED Discharge Orders    None       Cherrie Franca, Jeannett Senior, MD 05/31/18 0214

## 2018-05-30 NOTE — Consult Note (Signed)
Date of Service 05/30/2018  [employer] TeleNeurology Consult Services  Comments: Last time known well:  _ 1830 Door time:  _2037 [employer] contacted: _2343 [employer] at bedside: _2349 NIHSS assessment time: _ 23:57 consult end time: _ 00:07 --------------------------------------------------------------------------- Impression:  headache and transient speech impairment, concerning for seizure vs TIA vs systemic cause such as hypotension/arrhythmia vs complicated migraine  Does (not) meet Large Vessel Occlusion (LVO) screening criteria (Aphasia, Neglect, Gaze deviation/preference, Dense hemiparesis, or Visual field deficits on exam), therefore advanced imaging (CTA head and neck and CTP brain) is (not) indicated.   Differential Diagnosis:  1. Cardioembolic stroke  2. Small vessel disease/ lacune  3. Thromboembolic, artery-to-artery mechanism  4. Hypercoagulable state-related infarct  5. Transient ischemic attack  6. Thrombotic mechanism, large artery disease   ------------------------------------------------------------------------------------------- tPA decision and other recommendations:   _ Patient is not a tPA candidate Head CT did not show any acute hemorrhage. 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 Patient is not a NIR candidate: _ Thrombectomy not considered since large proximal intracranial vessel occlusion is not suspected.  Recommendations dysphagia screen ASA if no contraindications head of bed flat IV fluids NS  administer diphenhydramine (Benadryl) 25 mg IV followed by prochlorperazine (Compazine) 10 mg IV, ketorolac (Toradol) 30 mg IV or 60 mg IM. If headache improves same regimen can be repeated every 8  hours. Stroke work up with: noncontrast brain MRI, head and neck MRA (or CTA), 2D ECHO, lipid panel, HbA1c (Goal LDL<70, HbA1c<7)  Consider EEG  Increase topiramate from 200mg  daily to 100mg  AM and 150mg  PM  inpatient neurology consultation Inpatient stroke evaluation as per Neurology/ Internal Medicine Discussed with ED physician/medical staff Please contact [employer] Navigator to reach me if further questions/concerns arise.   -------------------------------------------------------------------------------------------- Reason for Stroke Alert and History of Present Illness:  _ Patient is a(n) 22 years old  female, with history of seizures, migraines, cerebral palsy. last known well: 1830  started feeling sweaty, lightheaded  at about 19:30 developed speech impairment that lasted about an hour, somehow amnestic of that period.   ---------------------------------------------------------------------------------------------- Review of Systems:  Constitutional:  Negative except as documented in history of present illness.   Eye:  Negative except as documented in history of present illness.   Ear/Nose/Mouth/Throat:  Negative except as documented in history of present illness.   Respiratory:  Negative except as documented in history of present illness.   Cardiovascular:  Negative except as documented in history of present illness.   Gastrointestinal:  Negative except as documented in history of present illness.   Musculoskeletal:  Negative except as documented in history of present illness.   Neurologic:  Negative except as documented in history of present illness.    --------------------------------------------------------------------------------------------------- Examination:   NIHSS Details documented in the note  # NIHSS - NIH STROKE SCALE - 11 items:  1A: Level of consciousness: 0 - Alert; keenly responsive.  1B: Ask month and age: 75 - Both questions right  1C:  'Blink eyes' & 'squeeze hands': 0 - Performs both tasks  2: Horizontal extraocular movements: 0 - Normal.  3: Visual fields: 0 - No visual loss.  4: Facial palsy: 0 - Normal symmetry.  5A: Left arm motor drift: 0 - No drift for 10 seconds, or Amputation/joint fusion.  5B: Right arm motor drift: 0 - No drift for 10 seconds, or Amputation/joint fusion.  6A: Left leg motor drift: 0 - No drift for 5 seconds, or Amputation/joint fusion.  6B: Right leg motor drift: 0 - No drift for 5 seconds, or Amputation/joint fusion.  7: Limb Ataxia: 2 - Ataxia in 2 Limbs.  8: Sensation: 1 - Mild-moderate loss: less sharp/more dull, can sense being touched.  9: Language/aphasia: 1 - Mild-moderate aphasia: some obvious changes, without significant limitation.  10: Dysarthria: 0 - Normal, or Intubated/unable to test  11: Extinction/inattention: 0 - No abnormality   == Score: 4.  ------------------------------------------------------------------------------------------------------- Medical Decision Making:  - Extensive number of diagnosis or management options are considered above.   - Extensive amount of complex data reviewed.   - High risk of complication and/or morbidity or mortality are associated with differential diagnostic considerations above.  - There may be Uncertain outcome and increased probability of prolonged functional impairment or high probability of severe prolonged functional impairment associated with some of these differential diagnoses.  Medical Data Reviewed:  1.Data reviewed include clinical labs, radiology, Medical Tests;   2.Tests results discussed w/performing or interpreting physician;   3.Obtaining/reviewing old medical records;  4.Obtaining case history from another source;  5.Independent review of image, tracing or specimen.    When possible Patient/family were informed the Neurology Consult would happen via TeleHealth consult by way of interactive audio and video  telecommunications and consented to receiving care in this manner.  Case discussed with the Medical staff.  Critical Care notation:  I was called to see this critical patient emergently. I personally evaluated this critical patient for acute stroke evaluation and determining their eligibility for IV Alteplase and interventional therapies. I have spent approximately _18_ minutes with the patient, including time at bedside, time discussing the case with other physicians, reviewing plan of care, and time independently reviewing the records and scans.

## 2018-05-30 NOTE — ED Triage Notes (Signed)
Pt was brought in by [facility] for [facility]oc; pt was found at work sitting on floor aphasic; pt hx of seizures; pt c/o pain to front of head and numbness to right arm and leg; cbg 102; pt has sensitivity to light

## 2018-05-31 ENCOUNTER — Other Ambulatory Visit: Payer: Self-pay

## 2018-05-31 ENCOUNTER — Observation Stay (HOSPITAL_COMMUNITY): Payer: Medicaid Other

## 2018-05-31 ENCOUNTER — Encounter (HOSPITAL_COMMUNITY): Payer: Self-pay | Admitting: *Deleted

## 2018-05-31 ENCOUNTER — Observation Stay (HOSPITAL_COMMUNITY)
Admit: 2018-05-31 | Discharge: 2018-05-31 | Disposition: A | Discharge status: Home / Self Care | Payer: Medicaid Other | Attending: Cardiovascular Disease | Admitting: Cardiovascular Disease

## 2018-05-31 DIAGNOSIS — G43009 Migraine without aura, not intractable, without status migrainosus: Secondary | ICD-10-CM | POA: Diagnosis not present

## 2018-05-31 DIAGNOSIS — G809 Cerebral palsy, unspecified: Secondary | ICD-10-CM

## 2018-05-31 DIAGNOSIS — G459 Transient cerebral ischemic attack, unspecified: Secondary | ICD-10-CM

## 2018-05-31 DIAGNOSIS — R209 Unspecified disturbances of skin sensation: Secondary | ICD-10-CM

## 2018-05-31 DIAGNOSIS — G808 Other cerebral palsy: Secondary | ICD-10-CM

## 2018-05-31 DIAGNOSIS — E876 Hypokalemia: Secondary | ICD-10-CM | POA: Diagnosis not present

## 2018-05-31 DIAGNOSIS — R4702 Dysphasia: Secondary | ICD-10-CM

## 2018-05-31 DIAGNOSIS — G43109 Migraine with aura, not intractable, without status migrainosus: Secondary | ICD-10-CM

## 2018-05-31 LAB — BASIC METABOLIC PANEL
Anion gap: 9 (ref 5–15)
BUN: 10 mg/dL (ref 6–20)
CALCIUM: 8.9 mg/dL (ref 8.9–10.3)
CO2: 18 mmol/L — AB (ref 22–32)
CREATININE: 0.66 mg/dL (ref 0.44–1.00)
Chloride: 111 mmol/L (ref 98–111)
GFR calc Af Amer: >60 mL/min (ref >60)
GFR calc non Af Amer: >60 mL/min (ref >60)
GLUCOSE: 84 mg/dL (ref 70–99)
Potassium: 3 mmol/L — ABNORMAL LOW (ref 3.5–5.1)
Sodium: 138 mmol/L (ref 135–145)

## 2018-05-31 LAB — LIPID PANEL
CHOL/HDL RATIO: 2.8 ratio
CHOLESTEROL: 141 mg/dL (ref 0–200)
HDL: 50 mg/dL (ref >40)
LDL Cholesterol: 82 mg/dL (ref 0–99)
TRIGLYCERIDES: 44 mg/dL (ref <150)
VLDL: 9 mg/dL (ref 0–40)

## 2018-05-31 LAB — TROPONIN I

## 2018-05-31 LAB — RAPID URINE DRUG SCREEN, HOSP PERFORMED
Amphetamines: NOT DETECTED
BARBITURATES: NOT DETECTED
BENZODIAZEPINES: NOT DETECTED
Cocaine: NOT DETECTED
Opiates: NOT DETECTED
Tetrahydrocannabinol: NOT DETECTED

## 2018-05-31 LAB — URINALYSIS, ROUTINE W REFLEX MICROSCOPIC
Bilirubin Urine: NEGATIVE
Glucose, UA: NEGATIVE mg/dL
HGB URINE DIPSTICK: NEGATIVE
Ketones, ur: NEGATIVE mg/dL
LEUKOCYTES UA: NEGATIVE
Nitrite: NEGATIVE
Protein, ur: NEGATIVE mg/dL
Specific Gravity, Urine: 1.013 (ref 1.005–1.030)
pH: 6 (ref 5.0–8.0)

## 2018-05-31 LAB — ETHANOL

## 2018-05-31 LAB — MAGNESIUM: Magnesium: 2.2 mg/dL (ref 1.7–2.4)

## 2018-05-31 LAB — SALICYLATE LEVEL

## 2018-05-31 LAB — HEMOGLOBIN A1C
Hgb A1c MFr Bld: 5.2 % (ref 4.8–5.6)
Mean Plasma Glucose: 102.54 mg/dL

## 2018-05-31 LAB — ACETAMINOPHEN LEVEL: Acetaminophen (Tylenol), Serum: <10 ug/mL — ABNORMAL LOW (ref 10–30)

## 2018-05-31 LAB — HCG, QUANTITATIVE, PREGNANCY: hCG, Beta Chain, Quant, S: <1 m[IU]/mL (ref <5)

## 2018-05-31 MED ORDER — LEVETIRACETAM 500 MG/5ML IV SOLN
INTRAVENOUS | Status: AC
  Filled 2018-05-31: qty 10

## 2018-05-31 MED ORDER — POTASSIUM CHLORIDE CRYS ER 20 MEQ PO TBCR
40.0000 meq | EXTENDED_RELEASE_TABLET | Freq: Once | ORAL | Status: AC
  Administered 2018-05-31: 40 meq via ORAL
  Filled 2018-05-31: qty 2

## 2018-05-31 MED ORDER — SUMATRIPTAN SUCCINATE 50 MG PO TABS: 100.0000 mg | ORAL_TABLET | ORAL | Status: DC | PRN

## 2018-05-31 MED ORDER — DIPHENHYDRAMINE HCL 50 MG/ML IJ SOLN
25.0000 mg | Freq: Once | INTRAMUSCULAR | Status: AC
  Administered 2018-05-31: 25 mg via INTRAVENOUS
  Filled 2018-05-31: qty 1

## 2018-05-31 MED ORDER — ACETAMINOPHEN 325 MG PO TABS
650.0000 mg | ORAL_TABLET | ORAL | Status: DC | PRN
  Administered 2018-05-31: 650 mg via ORAL
  Filled 2018-05-31: qty 2

## 2018-05-31 MED ORDER — ASPIRIN 81 MG PO CHEW
324.0000 mg | CHEWABLE_TABLET | Freq: Once | ORAL | Status: AC
  Administered 2018-05-31: 324 mg via ORAL
  Filled 2018-05-31: qty 4

## 2018-05-31 MED ORDER — METOCLOPRAMIDE HCL 5 MG/ML IJ SOLN
10.0000 mg | Freq: Once | INTRAMUSCULAR | Status: AC
  Administered 2018-05-31: 10 mg via INTRAVENOUS
  Filled 2018-05-31: qty 2

## 2018-05-31 MED ORDER — TOPIRAMATE ER 200 MG PO CAP24: 1.0000 | ORAL_CAPSULE | Freq: Every day | ORAL | Status: DC

## 2018-05-31 MED ORDER — KETOROLAC TROMETHAMINE 30 MG/ML IJ SOLN
30.0000 mg | Freq: Once | INTRAMUSCULAR | Status: AC
  Administered 2018-05-31: 30 mg via INTRAVENOUS
  Filled 2018-05-31: qty 1

## 2018-05-31 MED ORDER — LEVETIRACETAM 250 MG PO TABS
250.0000 mg | ORAL_TABLET | Freq: Two times a day (BID) | ORAL | Status: DC
  Administered 2018-05-31 – 2018-06-01 (×2): 250 mg via ORAL
  Filled 2018-05-31 (×2): qty 1

## 2018-05-31 MED ORDER — LEVETIRACETAM 250 MG PO TABS: 250.0000 mg | ORAL_TABLET | Freq: Two times a day (BID) | ORAL | Status: DC

## 2018-05-31 MED ORDER — SODIUM CHLORIDE 0.9 % IV SOLN
750.0000 mg | Freq: Once | INTRAVENOUS | Status: AC
  Administered 2018-05-31: 750 mg via INTRAVENOUS
  Filled 2018-05-31: qty 7.5

## 2018-05-31 MED ORDER — ACETAMINOPHEN 160 MG/5ML PO SOLN: 650.0000 mg | ORAL | Status: DC | PRN

## 2018-05-31 MED ORDER — ACETAMINOPHEN 650 MG RE SUPP: 650.0000 mg | RECTAL | Status: DC | PRN

## 2018-05-31 MED ORDER — LEVETIRACETAM 500 MG PO TABS: 500.0000 mg | ORAL_TABLET | Freq: Two times a day (BID) | ORAL | Status: DC

## 2018-05-31 MED ORDER — ASPIRIN 325 MG PO TABS
325.0000 mg | ORAL_TABLET | Freq: Every day | ORAL | Status: DC
  Administered 2018-06-01: 325 mg via ORAL
  Filled 2018-05-31 (×2): qty 1

## 2018-05-31 MED ORDER — SENNOSIDES-DOCUSATE SODIUM 8.6-50 MG PO TABS: 1.0000 | ORAL_TABLET | Freq: Every evening | ORAL | Status: DC | PRN

## 2018-05-31 MED ORDER — RIZATRIPTAN BENZOATE 10 MG PO TBDP: 10.0000 mg | ORAL_TABLET | Freq: Three times a day (TID) | ORAL | Status: DC | PRN

## 2018-05-31 NOTE — Progress Notes (Signed)
PROGRESS NOTE  Sara Hubbard WUJ:[id number] DOB: [birth year] DOA: 05/30/2018 PCP: Richardean Chimera, MD  Brief History:  22 year old female with a history of mild cerebral palsy, seizure disorder, migraine headaches presented with right-sided dysesthesias and dysphasia.  Around 6:30 PM on 05/30/2018, the patient developed a bifrontal headache with associated diaphoresis and some dizziness.  The patient subsequently sat down because she was not feeling well and began experiencing some numbness on her right arm and right leg.  Subsequently, the patient sat on the floor, but did not fall or lose consciousness.  Her friend subsequently arrived and helped her sit up in the chair at which time she had another episode of dizziness.  Subsequently, the patient described but subsequently happened as a "blur".  She remembered EMS arrival, but had trouble getting her words out.  The patient had denied any new medications or over-the-counter medications.  She denies any fevers, chills, chest pain, shortness breath, nausea, vomiting, diarrhea.  In the emergency department, the patient was given a headache cocktail with improvement of her headaches and subsequent improvement of her dysphasia and right-sided dysesthesias. The patient was seen by tele-neurology who recommended admission and MRI.  Assessment/Plan: Sensory disturbance/Dysphasia -suspect complicated migraine vs Todd's paralysis -symptoms improved -MRI brain -EEG -CTA head and neck--neg -CT brain neg -A1C--pending -LDL--pending -PT eval -continue ASA -neurology consult -UA and UDS neg -topamax may also cause word finding difficulty  Hypokalemia -replete -check mag -repeat BMP  Migraine headache -continue Trokendi XR -continue topamax      Disposition Plan:   Home in 1-2 days  Family Communication:   Family updated at bedside 9/26--Total time spent 35 minutes.  Greater than 50% spent face to face counseling and coordinating  care. 0630 to 0705  Consultants:  neurology  Code Status:  FULL  DVT Prophylaxis:   [place] Lovenox   Procedures: As Listed in Progress Note Above  Antibiotics: None    Subjective: Patient denies fevers, chills, headache, chest pain, dyspnea, nausea, vomiting, diarrhea, abdominal pain, dysuria, hematuria, hematochezia, and melena.   Objective: Vitals:   05/31/18 0200 05/31/18 0237 05/31/18 0237 05/31/18 0437  BP: (!) 86/62  113/72 110/75  Pulse: 73  68 (!) 52  Resp: (!) 22  20 16   Temp:   99.2 F (37.3 C)   TempSrc:   Oral   SpO2: 99%  100% 100%  Weight:  88.6 kg    Height:  5\' 4"  (1.626 m)     No intake or output data in the 24 hours ending 05/31/18 0658 Weight change:  Exam:   General:  Pt is alert, follows commands appropriately, not in acute distress  HEENT: No icterus, No thrush, No neck mass, [place]/AT  Cardiovascular: RRR, S1/S2, no rubs, no gallops  Respiratory: CTA bilaterally, no wheezing, no crackles, no rhonchi  Abdomen: Soft/+BS, non tender, non distended, no guarding  Extremities: No edema, No lymphangitis, No petechiae, No rashes, no synovitis  Neuro:  CN II-XII intact, strength 4/5 in RUE, RLE, strength 4/5 LUE, LLE; sensation intact bilateral; no dysmetria; babinski equivocal     Data Reviewed: I have personally reviewed following labs and imaging studies Basic Metabolic Panel: Recent Labs  Lab 05/30/18 2319  NA 139  K 3.2*  CL 112*  CO2 19*  GLUCOSE 91  BUN 10  CREATININE 0.71  CALCIUM 9.2   Liver Function Tests: Recent Labs  Lab 05/30/18 2319  AST 13*  ALT 10  ALKPHOS 57  BILITOT 0.5  PROT 7.1  ALBUMIN 4.1   No results for input(s): LIPASE, AMYLASE in the last 168 hours. No results for input(s): AMMONIA in the last 168 hours. Coagulation Profile: Recent Labs  Lab 05/30/18 2319  INR 0.98   CBC: Recent Labs  Lab 05/30/18 2319  WBC 12.3*  NEUTROABS 7.9*  8.2*  HGB 14.2  HCT 41.2  MCV 91.2  PLT 241   Cardiac  Enzymes: Recent Labs  Lab 05/30/18 2319  TROPONINI <0.03   BNP: Invalid input(s): POCBNP CBG: No results for input(s): GLUCAP in the last 168 hours. HbA1C: No results for input(s): HGBA1C in the last 72 hours. Urine analysis:    Component Value Date/Time   COLORURINE YELLOW 05/30/2018 2250   APPEARANCEUR CLEAR 05/30/2018 2250   LABSPEC 1.013 05/30/2018 2250   PHURINE 6.0 05/30/2018 2250   GLUCOSEU NEGATIVE 05/30/2018 2250   HGBUR NEGATIVE 05/30/2018 2250   BILIRUBINUR NEGATIVE 05/30/2018 2250   KETONESUR NEGATIVE 05/30/2018 2250   PROTEINUR NEGATIVE 05/30/2018 2250   UROBILINOGEN 4.0 (H) 05/01/2007 1740   NITRITE NEGATIVE 05/30/2018 2250   LEUKOCYTESUR NEGATIVE 05/30/2018 2250   Sepsis Labs: @LABRCNTIP (procalcitonin:4,lacticidven:4) )No results found for this or any previous visit (from the past 240 hour(s)).   Scheduled Meds: . aspirin  325 mg Oral Daily  . Topiramate ER  1 capsule Oral QHS   Continuous Infusions:  Procedures/Studies: Ct Angio Head W Or Wo Contrast  Result Date: 05/31/2018 CLINICAL DATA:  Initial evaluation for acute headache with transient speech impairment. EXAM: CT ANGIOGRAPHY HEAD AND NECK TECHNIQUE: Multidetector CT imaging of the head and neck was performed using the standard protocol during bolus administration of intravenous contrast. Multiplanar CT image reconstructions and MIPs were obtained to evaluate the vascular anatomy. Carotid stenosis measurements (when applicable) are obtained utilizing NASCET criteria, using the distal internal carotid diameter as the denominator. CONTRAST:  ISOVUE-370 IOPAMIDOL (ISOVUE-370) INJECTION 76% COMPARISON:  Prior CT from earlier the same day. FINDINGS: CTA NECK FINDINGS Aortic arch: Visualized aortic arch of normal caliber with normal branch pattern. No flow-limiting stenosis about the origin of the great vessels. Visualized subclavian arteries widely patent. Right carotid system: Right common and internal  carotid arteries widely patent without stenosis, dissection, or occlusion. Left carotid system: Left common and internal carotid arteries widely patent without stenosis, dissection, or occlusion. Vertebral arteries: Both vertebral arteries arise from the subclavian arteries. Pre foraminal left V1 segment not well assessed due to adjacent venous contamination. Vertebral arteries otherwise widely patent without stenosis, dissection, or occlusion. Skeleton: No acute osseus abnormality. No discrete lytic or blastic osseous lesions. Other neck: No other acute soft tissue abnormality within the neck. Upper chest: Visualized upper chest within normal limits. Review of the MIP images confirms the above findings CTA HEAD FINDINGS Anterior circulation: Internal carotid arteries widely patent to the termini without stenosis. A1 segments patent bilaterally. Left A1 hypoplastic. Normal anterior communicating artery. Anterior cerebral arteries widely patent to their distal aspects. M1 segments widely patent without stenosis. Normal MCA bifurcations. No proximal M2 occlusion. Distal MCA branches well perfused and symmetric. Posterior circulation: Vertebral arteries widely patent to the vertebrobasilar junction. Basilar artery widely patent to its distal aspect. Superior cerebral arteries patent bilaterally. Fetal type origin of the left PCA supplied via a prominent and widely patent left posterior communicating artery. Hypoplastic right P1 with prominent right posterior communicating artery as well. PCAs widely patent to their distal aspects. Venous sinuses: Patent. Anatomic variants: Fetal type origin  of the left PCA. Hypoplastic left P1. No intracranial aneurysm or other vascular abnormality. Delayed phase: No pathologic enhancement. Review of the MIP images confirms the above findings IMPRESSION: Normal CTA of the head and neck. No large vessel occlusion. No hemodynamically significant or correctable stenosis. Electronically  Signed   By: Rise Mu M.D.   On: 05/31/2018 01:34   Ct Angio Neck W And/or Wo Contrast  Result Date: 05/31/2018 CLINICAL DATA:  Initial evaluation for acute headache with transient speech impairment. EXAM: CT ANGIOGRAPHY HEAD AND NECK TECHNIQUE: Multidetector CT imaging of the head and neck was performed using the standard protocol during bolus administration of intravenous contrast. Multiplanar CT image reconstructions and MIPs were obtained to evaluate the vascular anatomy. Carotid stenosis measurements (when applicable) are obtained utilizing NASCET criteria, using the distal internal carotid diameter as the denominator. CONTRAST:  ISOVUE-370 IOPAMIDOL (ISOVUE-370) INJECTION 76% COMPARISON:  Prior CT from earlier the same day. FINDINGS: CTA NECK FINDINGS Aortic arch: Visualized aortic arch of normal caliber with normal branch pattern. No flow-limiting stenosis about the origin of the great vessels. Visualized subclavian arteries widely patent. Right carotid system: Right common and internal carotid arteries widely patent without stenosis, dissection, or occlusion. Left carotid system: Left common and internal carotid arteries widely patent without stenosis, dissection, or occlusion. Vertebral arteries: Both vertebral arteries arise from the subclavian arteries. Pre foraminal left V1 segment not well assessed due to adjacent venous contamination. Vertebral arteries otherwise widely patent without stenosis, dissection, or occlusion. Skeleton: No acute osseus abnormality. No discrete lytic or blastic osseous lesions. Other neck: No other acute soft tissue abnormality within the neck. Upper chest: Visualized upper chest within normal limits. Review of the MIP images confirms the above findings CTA HEAD FINDINGS Anterior circulation: Internal carotid arteries widely patent to the termini without stenosis. A1 segments patent bilaterally. Left A1 hypoplastic. Normal anterior communicating artery.  Anterior cerebral arteries widely patent to their distal aspects. M1 segments widely patent without stenosis. Normal MCA bifurcations. No proximal M2 occlusion. Distal MCA branches well perfused and symmetric. Posterior circulation: Vertebral arteries widely patent to the vertebrobasilar junction. Basilar artery widely patent to its distal aspect. Superior cerebral arteries patent bilaterally. Fetal type origin of the left PCA supplied via a prominent and widely patent left posterior communicating artery. Hypoplastic right P1 with prominent right posterior communicating artery as well. PCAs widely patent to their distal aspects. Venous sinuses: Patent. Anatomic variants: Fetal type origin of the left PCA. Hypoplastic left P1. No intracranial aneurysm or other vascular abnormality. Delayed phase: No pathologic enhancement. Review of the MIP images confirms the above findings IMPRESSION: Normal CTA of the head and neck. No large vessel occlusion. No hemodynamically significant or correctable stenosis. Electronically Signed   By: Rise Mu M.D.   On: 05/31/2018 01:34   Ct Head Code Stroke Wo Contrast  Result Date: 05/31/2018 CLINICAL DATA:  Code stroke. Initial evaluation for acute altered mental status, aphasia. EXAM: CT HEAD WITHOUT CONTRAST TECHNIQUE: Contiguous axial images were obtained from the base of the skull through the vertex without intravenous contrast. COMPARISON:  None. FINDINGS: Brain: Cerebral volume within normal limits for patient age. No evidence for acute intracranial hemorrhage. No findings to suggest acute large vessel territory infarct. No mass lesion, midline shift, or mass effect. Ventricles are normal in size without evidence for hydrocephalus. No extra-axial fluid collection identified. Vascular: No hyperdense vessel identified. Skull: Scalp soft tissues demonstrate no acute abnormality. Calvarium intact. Sinuses/Orbits: Globes and orbital soft tissues  within normal limits.  Air-fluid level noted within the right sphenoid sinus. Paranasal sinuses are otherwise clear. No mastoid effusion. ASPECTS [facility] Stroke Program Early CT Score) - Ganglionic level infarction (caudate, lentiform nuclei, internal capsule, insula, M1-M3 cortex): 7 - Supraganglionic infarction (M4-M6 cortex): 3 Total score (0-10 with 10 being normal): 10 IMPRESSION: 1. No acute intracranial abnormality. 2. ASPECTS is 10. 3. Acute right sphenoid sinusitis. Critical Value/emergent results were called by telephone at the time of interpretation on 05/31/2018 at 12:01 am to Dr. Glynn Octave , who verbally acknowledged these results. Electronically Signed   By: Rise Mu M.D.   On: 05/31/2018 00:03    Catarina Hartshorn, DO  [facility] Pager [phone]  If 7PM-7AM, please contact night-coverage [email or web address] Password [email or web address] 05/31/2018, 6:58 AM   LOS: 0 days

## 2018-05-31 NOTE — Progress Notes (Signed)
Stroke re-assessment and vital signs not obtained at this time. Pt off the floor and unavailable due to MRI.

## 2018-05-31 NOTE — Consult Note (Signed)
[place] A. Merlene Laughter, MD     [email or web address]          Sara Hubbard is an 22 y.o. female.   ASSESSMENT/PLAN: 1.  Unexplained combination of symptoms: Differential diagnosis includes epileptic events, migraines and psychogenic events.  I suggest that we add Keppra to the regimen.  Given that she has had 3- EEGs, I think the likelihood of the events being epileptic are likely low but I think we will treat her empirically for now.  More extensive EEG such as 72hr continuous monitoring is recommended as an outpatient.  We should continue with the extended release topiramate.  She can follow-up with her baseline neurologist.  No driving or operating machine is recommended at this time.    Patient is a 22 year old white female who has had episodes of syncope in the past.  She presents with a similar event but this time she reports that lasting longer and more severe.  She tells me the semiology was similar to her previous syncopal episodes.  She tells me that she becomes diaphoretic and weak.  She then passes out to the floor without losing consciousness but seems to be amnestic to the event.  This event lasted much longer however and was associated with right-sided weakness and numbness along with difficulty speaking.  The length of the event and the focal deficits seems to be different than her previous events.  The entire episode lasted for several hours.  This event was also associated with what appears to be stiffening/clonic activity on the right side as related to her by her friend.  The patient tells me that her last event was December 2017.  She has a history of migraine headaches and apparently has frequent headaches occurring at least twice weekly.  She has been managed on extended release topiramate.  The review of systems otherwise negative.    GENERAL:  This u as an obese female in no acute distress.  HEENT: Neck is supple and no trauma appreciated.  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  This apparently is chronic per the family.  ABDOMEN: soft  EXTREMITIES: No edema   BACK: Normal  SKIN: Normal by inspection.    MENTAL STATUS: Alert and oriented. Speech, language and cognition are generally intact. Judgment and insight normal.   CRANIAL NERVES: Pupils are equal, round and reactive to light and accomodation; extra ocular movements are full, there is no significant nystagmus; visual fields are full; upper and lower facial muscles are normal in strength and symmetric - (except right ptosis mild as mentioned above), there is no flattening of the nasolabial folds; tongue is midline; uvula is midline; shoulder elevation is normal.  MOTOR: Normal tone, bulk and strength; no pronator drift.  COORDINATION: Left finger to nose is normal, right finger to nose is normal, No rest tremor; no intention tremor; no postural tremor; no bradykinesia.  REFLEXES: Deep tendon reflexes are symmetrical and normal. Plantar reflexes are flexor bilaterally.   SENSATION: Normal to light touch, temperature, and pain.       Blood pressure 112/73, pulse 76, temperature 98.1 F (36.7 C), temperature source Oral, resp. rate 18, height '5\' 4"'$  (1.626 m), weight 88.6 kg, last menstrual period 05/25/2018, SpO2 99 %.  Past Medical History:  Diagnosis Date  . Cerebral palsy ([place])   . Common migraine 08/23/2016  . Disturbance of skin sensation 12/30/2013  . Headache(784.0) 12/30/2013  . Migraine   . Pain in limb 12/30/2013  .  Syncope     Past Surgical History:  Procedure Laterality Date  . EYE SURGERY    . TOOTH EXTRACTION      Family History  Problem Relation Age of Onset  . Hypertension Father   . Diabetes Maternal Grandmother   . Diabetes Paternal Grandmother   . Seizures Paternal Grandmother   . Diabetes Mother   . Seizures Paternal Grandfather     Social History:  reports that she has never smoked. She has never used smokeless tobacco. She  reports that she does not drink alcohol or use drugs.  Allergies: No Known Allergies  Medications: Prior to Admission medications   Medication Sig Start Date End Date Taking? Authorizing Provider  medroxyPROGESTERone (DEPO-PROVERA) 150 MG/ML injection Inject 150 mg into the muscle every 3 (three) months.    Yes [provider]  rizatriptan (MAXALT-MLT) 10 MG disintegrating tablet TAKE 1 TABLET BY MOUTH THREE TIMES DAILY AS NEEDED FOR MIGRAINE Patient taking differently: Take 10 mg by mouth 3 (three) times daily as needed for migraine.  08/23/17  Yes Ward Givens, NP  Topiramate ER (TROKENDI XR) 200 MG CP24 Take 1 capsule by mouth daily. 02/26/18  Yes Ward Givens, NP    Scheduled Meds: . aspirin  325 mg Oral Daily  . Topiramate ER  1 capsule Oral QHS   Continuous Infusions: PRN Meds:.acetaminophen **OR** acetaminophen (TYLENOL) oral liquid 160 mg/5 mL **OR** acetaminophen, senna-docusate, SUMAtriptan     Results for orders placed or performed during the hospital encounter of 05/30/18 (from the past 48 hour(s))  Rapid urine drug screen (hospital performed)     Status: None   Collection Time: 05/30/18 10:50 PM  Result Value Ref Range   Opiates NONE DETECTED NONE DETECTED   Cocaine NONE DETECTED NONE DETECTED   Benzodiazepines NONE DETECTED NONE DETECTED   Amphetamines NONE DETECTED NONE DETECTED   Tetrahydrocannabinol NONE DETECTED NONE DETECTED   Barbiturates NONE DETECTED NONE DETECTED    Comment: (NOTE) DRUG SCREEN FOR MEDICAL PURPOSES ONLY.  IF CONFIRMATION IS NEEDED FOR ANY PURPOSE, NOTIFY LAB WITHIN 5 DAYS. LOWEST DETECTABLE LIMITS FOR URINE DRUG SCREEN Drug Class                     Cutoff (ng/mL) Amphetamine and metabolites    1000 Barbiturate and metabolites    200 Benzodiazepine                 503 Tricyclics and metabolites     300 Opiates and metabolites        300 Cocaine and metabolites        300 THC                            50 Performed at  [facility], [address], [place], [place] [address]   Urinalysis, Routine w reflex microscopic     Status: None   Collection Time: 05/30/18 10:50 PM  Result Value Ref Range   Color, Urine YELLOW YELLOW   APPearance CLEAR CLEAR   Specific Gravity, Urine 1.013 1.005 - 1.030   pH 6.0 5.0 - 8.0   Glucose, UA NEGATIVE NEGATIVE mg/dL   Hgb urine dipstick NEGATIVE NEGATIVE   Bilirubin Urine NEGATIVE NEGATIVE   Ketones, ur NEGATIVE NEGATIVE mg/dL   Protein, ur NEGATIVE NEGATIVE mg/dL   Nitrite NEGATIVE NEGATIVE   Leukocytes, UA NEGATIVE NEGATIVE    Comment: Performed at [facility], [address],  [place], [place] [address]  CBC with Differential/Platelet     Status: Abnormal   Collection Time: 05/30/18 11:19 PM  Result Value Ref Range   WBC 12.3 (H) 4.0 - 10.5 K/uL   RBC 4.52 3.87 - 5.11 MIL/uL   Hemoglobin 14.2 12.0 - 15.0 g/dL   HCT 41.2 36.0 - 46.0 %   MCV 91.2 78.0 - 100.0 fL   MCH 31.4 26.0 - 34.0 pg   MCHC 34.5 30.0 - 36.0 g/dL   RDW 12.6 11.5 - 15.5 %   Platelets 241 150 - 400 K/uL   Neutrophils Relative % 67 %   Neutro Abs 8.2 (H) 1.7 - 7.7 K/uL   Lymphocytes Relative 27 %   Lymphs Abs 3.4 0.7 - 4.0 K/uL   Monocytes Relative 6 %   Monocytes Absolute 0.7 0.1 - 1.0 K/uL   Eosinophils Relative 0 %   Eosinophils Absolute 0.1 0.0 - 0.7 K/uL   Basophils Relative 0 %   Basophils Absolute 0.0 0.0 - 0.1 K/uL    Comment: Performed at [facility], [address], [place], [place] [address]  Comprehensive metabolic panel     Status: Abnormal   Collection Time: 05/30/18 11:19 PM  Result Value Ref Range   Sodium 139 135 - 145 mmol/L   Potassium 3.2 (L) 3.5 - 5.1 mmol/L   Chloride 112 (H) 98 - 111 mmol/L   CO2 19 (L) 22 - 32 mmol/L   Glucose, Bld 91 70 - 99 mg/dL   BUN 10 6 - 20 mg/dL   Creatinine, Ser 0.71 0.44 - 1.00 mg/dL   Calcium 9.2 8.9 - 10.3 mg/dL   Total Protein 7.1 6.5 - 8.1 g/dL   Albumin 4.1 3.5 - 5.0 g/dL   AST 13 (L) 15 - 41 U/L   ALT 10 0 - 44 U/L    Alkaline Phosphatase 57 38 - 126 U/L   Total Bilirubin 0.5 0.3 - 1.2 mg/dL   GFR calc non Af Amer >60 >60 mL/min   GFR calc Af Amer >60 >60 mL/min    Comment: (NOTE) The eGFR has been calculated using the CKD EPI equation. This calculation has not been validated in all clinical situations. eGFR's persistently <60 mL/min signify possible Chronic Kidney Disease.    Anion gap 8 5 - 15    Comment: Performed at [facility], [address] [place], [place] [address]  Ethanol     Status: None   Collection Time: 05/30/18 11:19 PM  Result Value Ref Range   Alcohol, Ethyl (B) <10 <10 mg/dL    Comment: (NOTE) Lowest detectable limit for serum alcohol is 10 mg/dL. For medical purposes only. Performed at [facility], [address], [place], [place] [address]   Salicylate level     Status: None   Collection Time: 05/30/18 11:19 PM  Result Value Ref Range   Salicylate Lvl <3.4 2.8 - 30.0 mg/dL    Comment: Performed at [facility], [address] [place], [place] [address]  Acetaminophen level     Status: Abnormal   Collection Time: 05/30/18 11:19 PM  Result Value Ref Range   Acetaminophen (Tylenol), Serum <10 (L) 10 - 30 ug/mL    Comment: (NOTE) Therapeutic concentrations vary significantly. A range of 10-30 ug/mL  may be an effective concentration for many patients. However, some  are best treated at concentrations outside of this range. Acetaminophen concentrations >150 ug/mL at 4 hours after ingestion  and >50 ug/mL at 12 hours after ingestion are  often associated with  toxic reactions. Performed at [facility], [address] [place], [place] [address]   Protime-INR     Status: None   Collection Time: 05/30/18 11:19 PM  Result Value Ref Range   Prothrombin Time 12.9 11.4 - 15.2 seconds   INR 0.98     Comment: Performed at [facility], [address] [place], [place] [address]  APTT     Status: None   Collection Time: 05/30/18 11:19 PM  Result Value Ref Range   aPTT 34 24 -  36 seconds    Comment: Performed at [facility], [address], [place], [place] [address]  Differential     Status: Abnormal   Collection Time: 05/30/18 11:19 PM  Result Value Ref Range   Neutrophils Relative % 65 %   Neutro Abs 7.9 (H) 1.7 - 7.7 K/uL   Lymphocytes Relative 28 %   Lymphs Abs 3.3 0.7 - 4.0 K/uL   Monocytes Relative 6 %   Monocytes Absolute 0.7 0.1 - 1.0 K/uL   Eosinophils Relative 1 %   Eosinophils Absolute 0.1 0.0 - 0.7 K/uL   Basophils Relative 0 %   Basophils Absolute 0.0 0.0 - 0.1 K/uL    Comment: Performed at [facility], [address] [place], [place] [address]  Troponin I     Status: None   Collection Time: 05/30/18 11:19 PM  Result Value Ref Range   Troponin I <0.03 <0.03 ng/mL    Comment: Performed at [facility], [address] [place], [place] [address]  hCG, quantitative, pregnancy     Status: None   Collection Time: 05/30/18 11:19 PM  Result Value Ref Range   hCG, Beta Chain, Quant, S <1 <5 mIU/mL    Comment:          GEST. AGE      CONC.  (mIU/mL)   <=1 WEEK        5 - 50     2 WEEKS       50 - 500     3 WEEKS       100 - 10,000     4 WEEKS     1,000 - 30,000     5 WEEKS     3,500 - 115,000   6-8 WEEKS     12,000 - 270,000    12 WEEKS     15,000 - 220,000        FEMALE AND NON-PREGNANT FEMALE:     LESS THAN 5 mIU/mL Performed at [facility], [address], [place], [place] [address]   Hemoglobin A1c     Status: None   Collection Time: 05/30/18 11:19 PM  Result Value Ref Range   Hgb A1c MFr Bld 5.2 4.8 - 5.6 %    Comment: (NOTE) Pre diabetes:          5.7%-6.4% Diabetes:              >6.4% Glycemic control for   <7.0% adults with diabetes    Mean Plasma Glucose 102.54 mg/dL    Comment: Performed at [facility] [address], [place], [place] [address]  Lipid panel     Status: None   Collection Time: 05/31/18  5:07 AM  Result Value Ref Range   Cholesterol 141 0 - 200 mg/dL   Triglycerides 44 <150 mg/dL   HDL 50 >40 mg/dL    Total CHOL/HDL Ratio 2.8 RATIO   VLDL 9 0 - 40  mg/dL   LDL Cholesterol 82 0 - 99 mg/dL    Comment:        Total Cholesterol/HDL:CHD Risk Coronary Heart Disease Risk Table                     Men   Women  1/2 Average Risk   3.4   3.3  Average Risk       5.0   4.4  2 X Average Risk   9.6   7.1  3 X Average Risk  23.4   11.0        Use the calculated Patient Ratio above and the CHD Risk Table to determine the patient's CHD Risk.        ATP III CLASSIFICATION (LDL):  <100     mg/dL   Optimal  100-129  mg/dL   Near or Above                    Optimal  130-159  mg/dL   Borderline  160-189  mg/dL   High  >190     mg/dL   Very High Performed at [facility], [address] [place], [place] [address]   Basic metabolic panel     Status: Abnormal   Collection Time: 05/31/18  5:07 AM  Result Value Ref Range   Sodium 138 135 - 145 mmol/L   Potassium 3.0 (L) 3.5 - 5.1 mmol/L   Chloride 111 98 - 111 mmol/L   CO2 18 (L) 22 - 32 mmol/L   Glucose, Bld 84 70 - 99 mg/dL   BUN 10 6 - 20 mg/dL   Creatinine, Ser 0.66 0.44 - 1.00 mg/dL   Calcium 8.9 8.9 - 10.3 mg/dL   GFR calc non Af Amer >60 >60 mL/min   GFR calc Af Amer >60 >60 mL/min    Comment: (NOTE) The eGFR has been calculated using the CKD EPI equation. This calculation has not been validated in all clinical situations. eGFR's persistently <60 mL/min signify possible Chronic Kidney Disease.    Anion gap 9 5 - 15    Comment: Performed at [facility], [address] [place], [place] [address]  Magnesium     Status: None   Collection Time: 05/31/18  5:07 AM  Result Value Ref Range   Magnesium 2.2 1.7 - 2.4 mg/dL    Comment: Performed at [facility], [address] [place], [place] [address]    Studies/Results:  BRAIN MRI FINDINGS: Brain: No acute infarction, hemorrhage, hydrocephalus, extra-axial collection or mass lesion. Pituitary has an upward convex appearance on sagittal T1 weighted imaging but is within normal  limits at craniocaudal measurement (8 mm) and is homogeneous in signal. 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 Two small FLAIR hyperintensities in the corpus callosum and right frontal white matter from nonspecific remote insult, stable from 2015. Normal brain volume.  Vascular: Major flow voids are preserved.  Skull and upper cervical spine: No evidence of marrow lesion.  Sinuses/Orbits: Mild mucosal thickening in the paranasal sinuses.  IMPRESSION: No acute finding or explanation for symptoms.    CTA HEAD NECK IMPRESSION: Normal CTA of the head and neck. No large vessel occlusion. No hemodynamically significant or correctable stenosis.    EEG IMPRESSION: Normal electroencephalogram, awake, asleep and with activation procedures. There are no focal lateralizing or epileptiform features.    The brain MRI scan is reviewed in person.  No acute findings are noted on DWI. There is  no evidence of microhemorrhage.  The pituitary seems somewhat full within a non-normal size.  No dysmorphic features or congenital findings observed.  The scan is essentially unrevealing.  Jamesina Gaugh A. Merlene Laughter, M.D.  Diplomate, Tax adviser of Psychiatry and Neurology ( Neurology). 05/31/2018, 5:55 PM

## 2018-05-31 NOTE — H&P (Signed)
History and Physical    Sara Hubbard ZOX:[id number] DOB: [birth year] DOA: 05/30/2018  PCP: Sara Chimera, MD  Patient coming from: work  I have personally briefly reviewed patient's old medical records in [facility]  Chief Complaint: right sided numbness/weakness, trouble speaking  HPI: Sara Hubbard is a 22 y.o. female with medical history significant of seizure disorder, chronic migraines, mild cerebral palsy presents with headache, right-sided numbness and weakness and expressive a aphasia.  Patient was at work when she started having a headache.  She had some associated lightheadedness and diaphoresis.  This is her typical prodrome before she has a seizure.  She is been well maintained on her Topamax and has not had a seizure since 2017.  When Family arrived she was sweaty and on the floor.  When sHe first arrived to the ED she did have some expressive aphasia that is now resolved.  Had also right arm numbness and weakness which is somewhat improved but still present.  Patient admits to compliance with all of her medications.  She states she has a migraine about 4 times a month.  Denies any alcohol tobacco or illegal drug use.  Potassium was slightly low at 3.2.  She had a slight  leukocytosis at 12,000 however her UA is  benign.  CT Angio of head and neck is unremarkable as well.  At baseline patient she  has some mild gait disturbance and some extraocular muscle dysfunction from her cerebral palsy.  ED Course: ED physician spoke with neurology on-call who recommended MRI of the brain.  She was given aspirin ,IV Benadryl ,Toradol ,and Reglan with eventual resolution of her headache.  Review of Systems: Denies chest pain shortness of breath fever All others reviewed with patient  and are  negative unless otherwise stated   Past Medical History:  Diagnosis Date  . Cerebral palsy (HCC)   . Common migraine 08/23/2016  . Disturbance of skin sensation 12/30/2013  . Headache(784.0)  12/30/2013  . Migraine   . Pain in limb 12/30/2013  . Syncope     Past Surgical History:  Procedure Laterality Date  . EYE SURGERY    . TOOTH EXTRACTION Eye surgery as child       reports that she has never smoked. She has never used smokeless tobacco. She reports that she does not drink alcohol or use drugs. she lives with her boyfriend  No Known Allergies  Family History  Problem Relation Age of Onset  . Hypertension Father   . Diabetes Maternal Grandmother   . Diabetes Paternal Grandmother   . Seizures Paternal Grandmother   . Diabetes Mother   . Seizures Paternal Grandfather      Prior to Admission medications   Medication Sig Start Date End Date Taking? Authorizing Provider  medroxyPROGESTERone (DEPO-PROVERA) 150 MG/ML injection Inject 150 mg into the muscle every 3 (three) months.    Yes [provider]  rizatriptan (MAXALT-MLT) 10 MG disintegrating tablet TAKE 1 TABLET BY MOUTH THREE TIMES DAILY AS NEEDED FOR MIGRAINE Patient taking differently: Take 10 mg by mouth 3 (three) times daily as needed for migraine.  08/23/17  Yes Butch Penny, NP  Topiramate ER (TROKENDI XR) 200 MG CP24 Take 1 capsule by mouth daily. 02/26/18  Yes Butch Penny, NP    Physical Exam: Vitals:   05/31/18 0132 05/31/18 0200 05/31/18 0237 05/31/18 0237  BP:  (!) 86/62  113/72  Pulse:  73  68  Resp:  (!) 22  20  Temp:  98.7 F (37.1 C)   99.2 F (37.3 C)  TempSrc:    Oral  SpO2:  99%  100%  Weight:   88.6 kg   Height:   5\' 4"  (1.626 m)     Constitutional: NAD, calm, comfortable Vitals:   05/31/18 0132 05/31/18 0200 05/31/18 0237 05/31/18 0237  BP:  (!) 86/62  113/72  Pulse:  73  68  Resp:  (!) 22  20  Temp: 98.7 F (37.1 C)   99.2 F (37.3 C)  TempSrc:    Oral  SpO2:  99%  100%  Weight:   88.6 kg   Height:   5\' 4"  (1.626 m)    Eyes: PERRL, lids and conjunctivae normal, intermittent nystagmus on left chronic ENMT: Mucous membranes are moist. Posterior pharynx  clear of any exudate or lesions.Normal dentition.  Neck: normal, supple, no masses,  Respiratory: clear to auscultation bilaterally, no wheezing, no crackles. Normal respiratory effort. No accessory muscle use.  Cardiovascular: Regular rate and rhythm, no murmurs / rubs / gallops. No extremity edema. 2+ pedal pulses. No carotid bruits.  Abdomen: no tenderness, no masses palpated. No hepatosplenomegaly. Bowel sounds positive.  Musculoskeletal: no clubbing / cyanosis. No joint deformity upper and lower extremities. Good ROM, no contractures. Normal muscle tone.  Skin: no rashes, lesions, ulcers. No induration Neurologic: CN 2-12 grossly intact. Right arm numbness, right hand grip weak compared to left  Left side intact   Psychiatric: Normal judgment and insight. Alert and oriented x 3. Normal mood.    Labs on Admission: I have personally reviewed following labs and imaging studies  CBC: Recent Labs  Lab 05/30/18 2319  WBC 12.3*  NEUTROABS 7.9*  8.2*  HGB 14.2  HCT 41.2  MCV 91.2  PLT 241   Basic Metabolic Panel: Recent Labs  Lab 05/30/18 2319  NA 139  K 3.2*  CL 112*  CO2 19*  GLUCOSE 91  BUN 10  CREATININE 0.71  CALCIUM 9.2   GFR: Estimated Creatinine Clearance: 118.9 mL/min (by C-G formula based on SCr of 0.71 mg/dL). Liver Function Tests: Recent Labs  Lab 05/30/18 2319  AST 13*  ALT 10  ALKPHOS 57  BILITOT 0.5  PROT 7.1  ALBUMIN 4.1   No results for input(s): LIPASE, AMYLASE in the last 168 hours. No results for input(s): AMMONIA in the last 168 hours. Coagulation Profile: Recent Labs  Lab 05/30/18 2319  INR 0.98   Cardiac Enzymes: Recent Labs  Lab 05/30/18 2319  TROPONINI <0.03   BNP (last 3 results) No results for input(s): PROBNP in the last 8760 hours. HbA1C: No results for input(s): HGBA1C in the last 72 hours. CBG: No results for input(s): GLUCAP in the last 168 hours. Lipid Profile: No results for input(s): CHOL, HDL, LDLCALC, TRIG,  CHOLHDL, LDLDIRECT in the last 72 hours. Thyroid Function Tests: No results for input(s): TSH, T4TOTAL, FREET4, T3FREE, THYROIDAB in the last 72 hours. Anemia Panel: No results for input(s): VITAMINB12, FOLATE, FERRITIN, TIBC, IRON, RETICCTPCT in the last 72 hours. Urine analysis:    Component Value Date/Time   COLORURINE YELLOW 05/30/2018 2250   APPEARANCEUR CLEAR 05/30/2018 2250   LABSPEC 1.013 05/30/2018 2250   PHURINE 6.0 05/30/2018 2250   GLUCOSEU NEGATIVE 05/30/2018 2250   HGBUR NEGATIVE 05/30/2018 2250   BILIRUBINUR NEGATIVE 05/30/2018 2250   KETONESUR NEGATIVE 05/30/2018 2250   PROTEINUR NEGATIVE 05/30/2018 2250   UROBILINOGEN 4.0 (H) 05/01/2007 1740   NITRITE NEGATIVE 05/30/2018 2250   LEUKOCYTESUR NEGATIVE 05/30/2018  2250    Radiological Exams on Admission: Ct Angio Head W Or Wo Contrast  Result Date: 05/31/2018 CLINICAL DATA:  Initial evaluation for acute headache with transient speech impairment. EXAM: CT ANGIOGRAPHY HEAD AND NECK TECHNIQUE: Multidetector CT imaging of the head and neck was performed using the standard protocol during bolus administration of intravenous contrast. Multiplanar CT image reconstructions and MIPs were obtained to evaluate the vascular anatomy. Carotid stenosis measurements (when applicable) are obtained utilizing NASCET criteria, using the distal internal carotid diameter as the denominator. CONTRAST:  ISOVUE-370 IOPAMIDOL (ISOVUE-370) INJECTION 76% COMPARISON:  Prior CT from earlier the same day. FINDINGS: CTA NECK FINDINGS Aortic arch: Visualized aortic arch of normal caliber with normal branch pattern. No flow-limiting stenosis about the origin of the great vessels. Visualized subclavian arteries widely patent. Right carotid system: Right common and internal carotid arteries widely patent without stenosis, dissection, or occlusion. Left carotid system: Left common and internal carotid arteries widely patent without stenosis, dissection, or  occlusion. Vertebral arteries: Both vertebral arteries arise from the subclavian arteries. Pre foraminal left V1 segment not well assessed due to adjacent venous contamination. Vertebral arteries otherwise widely patent without stenosis, dissection, or occlusion. Skeleton: No acute osseus abnormality. No discrete lytic or blastic osseous lesions. Other neck: No other acute soft tissue abnormality within the neck. Upper chest: Visualized upper chest within normal limits. Review of the MIP images confirms the above findings CTA HEAD FINDINGS Anterior circulation: Internal carotid arteries widely patent to the termini without stenosis. A1 segments patent bilaterally. Left A1 hypoplastic. Normal anterior communicating artery. Anterior cerebral arteries widely patent to their distal aspects. M1 segments widely patent without stenosis. Normal MCA bifurcations. No proximal M2 occlusion. Distal MCA branches well perfused and symmetric. Posterior circulation: Vertebral arteries widely patent to the vertebrobasilar junction. Basilar artery widely patent to its distal aspect. Superior cerebral arteries patent bilaterally. Fetal type origin of the left PCA supplied via a prominent and widely patent left posterior communicating artery. Hypoplastic right P1 with prominent right posterior communicating artery as well. PCAs widely patent to their distal aspects. Venous sinuses: Patent. Anatomic variants: Fetal type origin of the left PCA. Hypoplastic left P1. No intracranial aneurysm or other vascular abnormality. Delayed phase: No pathologic enhancement. Review of the MIP images confirms the above findings IMPRESSION: Normal CTA of the head and neck. No large vessel occlusion. No hemodynamically significant or correctable stenosis. Electronically Signed   By: Rise Mu M.D.   On: 05/31/2018 01:34   Ct Angio Neck W And/or Wo Contrast  Result Date: 05/31/2018 CLINICAL DATA:  Initial evaluation for acute headache with  transient speech impairment. EXAM: CT ANGIOGRAPHY HEAD AND NECK TECHNIQUE: Multidetector CT imaging of the head and neck was performed using the standard protocol during bolus administration of intravenous contrast. Multiplanar CT image reconstructions and MIPs were obtained to evaluate the vascular anatomy. Carotid stenosis measurements (when applicable) are obtained utilizing NASCET criteria, using the distal internal carotid diameter as the denominator. CONTRAST:  ISOVUE-370 IOPAMIDOL (ISOVUE-370) INJECTION 76% COMPARISON:  Prior CT from earlier the same day. FINDINGS: CTA NECK FINDINGS Aortic arch: Visualized aortic arch of normal caliber with normal branch pattern. No flow-limiting stenosis about the origin of the great vessels. Visualized subclavian arteries widely patent. Right carotid system: Right common and internal carotid arteries widely patent without stenosis, dissection, or occlusion. Left carotid system: Left common and internal carotid arteries widely patent without stenosis, dissection, or occlusion. Vertebral arteries: Both vertebral arteries arise from the subclavian  arteries. Pre foraminal left V1 segment not well assessed due to adjacent venous contamination. Vertebral arteries otherwise widely patent without stenosis, dissection, or occlusion. Skeleton: No acute osseus abnormality. No discrete lytic or blastic osseous lesions. Other neck: No other acute soft tissue abnormality within the neck. Upper chest: Visualized upper chest within normal limits. Review of the MIP images confirms the above findings CTA HEAD FINDINGS Anterior circulation: Internal carotid arteries widely patent to the termini without stenosis. A1 segments patent bilaterally. Left A1 hypoplastic. Normal anterior communicating artery. Anterior cerebral arteries widely patent to their distal aspects. M1 segments widely patent without stenosis. Normal MCA bifurcations. No proximal M2 occlusion. Distal MCA branches well  perfused and symmetric. Posterior circulation: Vertebral arteries widely patent to the vertebrobasilar junction. Basilar artery widely patent to its distal aspect. Superior cerebral arteries patent bilaterally. Fetal type origin of the left PCA supplied via a prominent and widely patent left posterior communicating artery. Hypoplastic right P1 with prominent right posterior communicating artery as well. PCAs widely patent to their distal aspects. Venous sinuses: Patent. Anatomic variants: Fetal type origin of the left PCA. Hypoplastic left P1. No intracranial aneurysm or other vascular abnormality. Delayed phase: No pathologic enhancement. Review of the MIP images confirms the above findings IMPRESSION: Normal CTA of the head and neck. No large vessel occlusion. No hemodynamically significant or correctable stenosis. Electronically Signed   By: Rise Mu M.D.   On: 05/31/2018 01:34   Ct Head Code Stroke Wo Contrast  Result Date: 05/31/2018 CLINICAL DATA:  Code stroke. Initial evaluation for acute altered mental status, aphasia. EXAM: CT HEAD WITHOUT CONTRAST TECHNIQUE: Contiguous axial images were obtained from the base of the skull through the vertex without intravenous contrast. COMPARISON:  None. FINDINGS: Brain: Cerebral volume within normal limits for patient age. No evidence for acute intracranial hemorrhage. No findings to suggest acute large vessel territory infarct. No mass lesion, midline shift, or mass effect. Ventricles are normal in size without evidence for hydrocephalus. No extra-axial fluid collection identified. Vascular: No hyperdense vessel identified. Skull: Scalp soft tissues demonstrate no acute abnormality. Calvarium intact. Sinuses/Orbits: Globes and orbital soft tissues within normal limits. Air-fluid level noted within the right sphenoid sinus. Paranasal sinuses are otherwise clear. No mastoid effusion. ASPECTS [facility] Stroke Program Early CT Score) - Ganglionic level  infarction (caudate, lentiform nuclei, internal capsule, insula, M1-M3 cortex): 7 - Supraganglionic infarction (M4-M6 cortex): 3 Total score (0-10 with 10 being normal): 10 IMPRESSION: 1. No acute intracranial abnormality. 2. ASPECTS is 10. 3. Acute right sphenoid sinusitis. Critical Value/emergent results were called by telephone at the time of interpretation on 05/31/2018 at 12:01 am to Dr. Glynn Octave , who verbally acknowledged these results. Electronically Signed   By: Rise Mu M.D.   On: 05/31/2018 00:03    EKG: Independently reviewed.  Sinus rhythm with occasional PACs  Assessment/Plan Principal Problem:   TIA (transient ischemic attack) Active Problems:   Common migraine   Hypokalemia   CP (cerebral palsy) (HCC) mild  -Antiplatelet therapy with Asprin/ neuro recommended, MRI brain, CTA brain and neck arteries unremarkable.   Monitor on telemetry, fasting lipid panel, consult rehab therapies, neurology consult.  Consider Todd's paralysis versus atypical migraine. -Continue home Topamax and Maxalt for seizures and migraines.  Headache resolved with the ED IV cocktail given -Replace  potassium p.o.  DVT prophylaxis: SCD Code Status: full   family Communication: Discussed with father at bedside  Disposition Plan: Home today likely  Admission status: Telemetry observation  Synetta Fail MD [facility] Pager [phone]  If 7PM-7AM, please contact night-coverage [email or web address] Password [facility]  05/31/2018, 3:57 AM

## 2018-05-31 NOTE — Progress Notes (Signed)
CODE STROKE 2340 EXAM STARTED 2348 EXAM FINISHED 2348 IMAGES SENT TO SOC 2349 EXAM COMPLETED IN EPIC 2350 [place] [facility] CALLED

## 2018-05-31 NOTE — Evaluation (Signed)
Physical Therapy Evaluation Patient Details Name: Sara Hubbard MRN: [id number] DOB: [birth year] Today's Date: 05/31/2018   History of Present Illness  Sara Hubbard is a 22 y.o. female with medical history significant of seizure disorder, chronic migraines, mild cerebral palsy presents with headache, right-sided numbness and weakness and expressive a aphasia.  Patient was at work when she started having a headache.  She had some associated lightheadedness and diaphoresis.  This is her typical prodrome before she has a seizure.  She is been well maintained on her Topamax and has not had a seizure since 2017.  When Family arrived she was sweaty and on the floor.  When sHe first arrived to the ED she did have some expressive aphasia that is now resolved.  Had also right arm numbness and weakness which is somewhat improved but still present.  Patient admits to compliance with all of her medications.  She states she has a migraine about 4 times a month.  Denies any alcohol tobacco or illegal drug use.  Potassium was slightly low at 3.2.  She had a slight  leukocytosis at 12,000 however her UA is  benign.  CT Angio of head and neck is unremarkable as well.  At baseline patient she  has some mild gait disturbance and some extraocular muscle dysfunction from her cerebral palsy.    Clinical Impression  Patient functioning at baseline for functional mobility and gait (see below) other than slightly slower than normal speed of cadence and c/o mild decrease of proprioception in right foot when taking steps, but no loss of balance or stumbling.  Plan:  Patient discharged from physical therapy to care of nursing for ambulation daily as tolerated for length of stay.    Follow Up Recommendations No PT follow up    Equipment Recommendations  None recommended by PT    Recommendations for Other Services       Precautions / Restrictions Precautions Precautions: Other (comment) Precaution Comments: H/O  seizures Restrictions Weight Bearing Restrictions: No      Mobility  Bed Mobility Overal bed mobility: Independent                Transfers Overall transfer level: Independent                  Ambulation/Gait Ambulation/Gait assistance: Modified independent (Device/Increase time) Gait Distance (Feet): 150 Feet Assistive device: None Gait Pattern/deviations: WFL(Within Functional Limits) Gait velocity: decreased   General Gait Details: demonstrates good return for ambulation on level surface and up/down ramps without loss of balance  Stairs Stairs: Yes Stairs assistance: Modified independent (Device/Increase time) Stair Management: One rail Right;One rail Left;Alternating pattern Number of Stairs: 9 General stair comments: Patient demonstrates good return for going up/down stairs using 1 siderail without loss of balance  Wheelchair Mobility    Modified Rankin (Stroke Patients Only)       Balance Overall balance assessment: No apparent balance deficits (not formally assessed)                                           Pertinent Vitals/Pain Pain Assessment: 0-10 Pain Score: 3  Pain Location: RUE Pain Descriptors / Indicators: Numbness Pain Intervention(s): Monitored during session    Home Living Family/patient expects to be discharged to:: Private residence Living Arrangements: Spouse/significant other   Type of Home: Apartment Home Access: Level entry     Home  Layout: Two level Home Equipment: None Additional Comments: Pt reports living with her boyfriend in a townhouse-style apartment    Prior Function Level of Independence: Independent      ADL's / Homemaking Assistance Needed: Prior to admission, independent with ADLs        Hand Dominance   Dominant Hand: Right    Extremity/Trunk Assessment   Upper Extremity Assessment Upper Extremity Assessment: Defer to OT evaluation    Lower Extremity Assessment Lower  Extremity Assessment: Overall WFL for tasks assessed    Cervical / Trunk Assessment Cervical / Trunk Assessment: Normal  Communication   Communication: No difficulties  Cognition Arousal/Alertness: Awake/alert Behavior During Therapy: WFL for tasks assessed/performed Overall Cognitive Status: Within Functional Limits for tasks assessed                                        General Comments      Exercises     Assessment/Plan    PT Assessment Patent does not need any further PT services  PT Problem List         PT Treatment Interventions      PT Goals (Current goals can be found in the Care Plan section)  Acute Rehab PT Goals Patient Stated Goal: return home PT Goal Formulation: With patient/family Time For Goal Achievement: 05/31/18 Potential to Achieve Goals: Good    Frequency     Barriers to discharge        Co-evaluation               AM-PAC PT "6 Clicks" Daily Activity  Outcome Measure Difficulty turning over in bed (including adjusting bedclothes, sheets and blankets)?: None Difficulty moving from lying on back to sitting on the side of the bed? : None Difficulty sitting down on and standing up from a chair with arms (e.g., wheelchair, bedside commode, etc,.)?: None Help needed moving to and from a bed to chair (including a wheelchair)?: None Help needed walking in hospital room?: None Help needed climbing 3-5 steps with a railing? : None 6 Click Score: 24    End of Session   Activity Tolerance: Patient tolerated treatment well Patient left: in bed;with call bell/phone within reach;with family/visitor present Nurse Communication: Mobility status PT Visit Diagnosis: Unsteadiness on feet (R26.81);Other abnormalities of gait and mobility (R26.89);Muscle weakness (generalized) (M62.81)    Time: 0630-1601 PT Time Calculation (min) (ACUTE ONLY): 26 min   Charges:   PT Evaluation $PT Eval Moderate Complexity: 1 Mod PT  Treatments $Gait Training: 23-37 mins        2:21 PM, 05/31/18 Ocie Bob, MPT Physical Therapist with [facility] [phone] office [phone] mobile phone

## 2018-05-31 NOTE — ED Notes (Signed)
Patient states that her right side still feels numb at this time.

## 2018-05-31 NOTE — Evaluation (Signed)
Occupational Therapy Evaluation Patient Details Name: Sara Hubbard MRN: [id number] DOB: 01-13-1996 Today's Date: 05/31/2018    History of Present Illness Sara Hubbard is a 22 y.o. female with medical history significant of seizure disorder, chronic migraines, mild cerebral palsy presents with headache, right-sided numbness and weakness and expressive a aphasia.  Patient was at work when she started having a headache.  She had some associated lightheadedness and diaphoresis.  This is her typical prodrome before she has a seizure.  She is Hubbard well maintained on her Topamax and has not had a seizure since 2017.  When Family arrived she was sweaty and on the floor.  When sHe first arrived to the ED she did have some expressive aphasia that is now resolved.  Had also right arm numbness and weakness which is somewhat improved but still present.  Patient admits to compliance with all of her medications.  She states she has a migraine about 4 times a month.  Denies any alcohol tobacco or illegal drug use.  Potassium was slightly low at 3.2.  She had a slight  leukocytosis at 12,000 however her UA is  benign.  CT Angio of head and neck is unremarkable as well.  At baseline patient she  has some mild gait disturbance and some extraocular muscle dysfunction from her cerebral palsy.   Clinical Impression   Prior to admission, pt was independent with ADLs and IADLs. Pt presents with right sided weakness, decreased grip strength, and impaired coordination that impacts her functional use of her right dominant upper extremity. Symptoms have improved since admission. Pt with history of cerebral palsy and slight ataxia at baseline. Pt will not need any further acute OT services. Recommend outpatient OT treatment to address right UE deficits noted above. Plan of care discussed with pt and she was agreeable to pursuing outpatient treatment. Thank you for this recommendation.        Follow Up Recommendations  Outpatient OT     Equipment Recommendations  None recommended by OT       Precautions / Restrictions Precautions Precautions: Other (comment) Precaution Comments: H/O seizures      Mobility Bed Mobility Overal bed mobility: Independent                Transfers Overall transfer level: Independent                        ADL either performed or assessed with clinical judgement   ADL Overall ADL's : Needs assistance/impaired     Grooming: Wash/dry hands;Oral care;Supervision/safety;Standing Grooming Details (indicate cue type and reason): Pt was able to independently complete toothbrushing task using right hand, needing extra time and increased attention in order to grasp toothbrush due to decreased grip strength in right hand.              Lower Body Dressing: Modified independent;Sit to/from stand Lower Body Dressing Details (indicate cue type and reason): Pt able to donn socks and pants with extra time allowed due to decreased coordination and diiscomfort of IV line             Functional mobility during ADLs: Supervision/safety       Vision Baseline Vision/History: Wears glasses Wears Glasses: At all times(astigmatism) Patient Visual Report: No change from baseline Vision Assessment?: No apparent visual deficits            Pertinent Vitals/Pain Pain Assessment: No/denies pain     Hand Dominance Right   Extremity/Trunk  Assessment Upper Extremity Assessment Upper Extremity Assessment: RUE deficits/detail RUE Deficits / Details: Right hand weakness and coordination deficits. RUE weaker than left at 4+/5 for right shoulder/elbow and 5/5 for left shoulder/elbow. RUE Sensation: WNL RUE Coordination: decreased fine motor   Lower Extremity Assessment Lower Extremity Assessment: Defer to PT evaluation   Cervical / Trunk Assessment Cervical / Trunk Assessment: Normal   Communication Communication Communication: No difficulties   Cognition  Arousal/Alertness: Awake/alert Behavior During Therapy: WFL for tasks assessed/performed Overall Cognitive Status: Within Functional Limits for tasks assessed                                                Home Living Family/patient expects to be discharged to:: Private residence Living Arrangements: Spouse/significant other   Type of Home: Apartment Home Access: Level entry     Home Layout: Two level               Home Equipment: None   Additional Comments: Pt reports living with her boyfriend in a townhouse-style apartment      Prior Functioning/Environment Level of Independence: Independent    ADL's / Homemaking Assistance Needed: Prior to admission, independent with ADLs            OT Problem List: Decreased strength;Decreased activity tolerance;Decreased coordination;Impaired UE functional use                          AM-PAC PT "6 Clicks" Daily Activity     Outcome Measure Help from another person eating meals?: None Help from another person taking care of personal grooming?: None Help from another person toileting, which includes using toliet, bedpan, or urinal?: None Help from another person bathing (including washing, rinsing, drying)?: None Help from another person to put on and taking off regular upper body clothing?: None Help from another person to put on and taking off regular lower body clothing?: None 6 Click Score: 24   End of Session    Activity Tolerance: Patient tolerated treatment well Patient left: in bed;with call bell/phone within reach;with nursing/sitter in room  OT Visit Diagnosis: Muscle weakness (generalized) (M62.81)                Time: 6295-2841 OT Time Calculation (min): 29 min Charges:  OT General Charges $OT Visit: 1 Visit OT Evaluation $OT Eval Low Complexity: 1 Low   Ezra Sites, OTR/L  [phone] 05/31/2018, 8:55 AM

## 2018-05-31 NOTE — Progress Notes (Signed)
EEG Completed; Results Pending. Dr Reynolds notified.  

## 2018-05-31 NOTE — Procedures (Signed)
ELECTROENCEPHALOGRAM REPORT   Patient: Sara Hubbard       Room #: A322 EEG No. ID: [id number] Age: 22 y.o.        Sex: female Referring Physician: Tat Report Date:  05/31/2018        Interpreting Physician: Thana Farr  History: [name] is an 22 y.o. female with a history of CP and seizures presenting with complaints of right sided numbness and dysphasia  Medications:  ASA, Topamax  Conditions of Recording:  This is a 21 channel routine scalp EEG performed with bipolar and monopolar montages arranged in accordance to the international 10/20 system of electrode placement. One channel was dedicated to EKG recording.  The patient is in the awake, drowsy and briefly asleep states.  Description:  The waking background activity consists of a low voltage, symmetrical, fairly well organized, 9 Hz alpha activity, seen from the parieto-occipital and posterior temporal regions.  Low voltage fast activity, poorly organized, is seen anteriorly and is at times superimposed on more posterior regions.  A mixture of theta and alpha rhythms are seen from the central and temporal regions. The patient drowses with slowing to irregular, low voltage theta and beta activity.   The patient goes in to a light sleep briefly with symmetrical sleep spindles, vertex central sharp transients and irregular slow activity.  No epileptiform activity is noted.   Hyperventilation performed and illicits a mild buildup but fails to elicit any abnormalities.  Intermittent photic stimulation was performed and elicits a symmetrical driving response but fails to elicit any abnormalities.  IMPRESSION: Normal electroencephalogram, awake, asleep and with activation procedures. There are no focal lateralizing or epileptiform features.   Thana Farr, MD Neurology [phone] 05/31/2018, 1:28 PM

## 2018-06-01 DIAGNOSIS — E876 Hypokalemia: Secondary | ICD-10-CM | POA: Diagnosis not present

## 2018-06-01 DIAGNOSIS — G43109 Migraine with aura, not intractable, without status migrainosus: Secondary | ICD-10-CM | POA: Diagnosis not present

## 2018-06-01 DIAGNOSIS — R209 Unspecified disturbances of skin sensation: Secondary | ICD-10-CM | POA: Diagnosis not present

## 2018-06-01 DIAGNOSIS — R4702 Dysphasia: Secondary | ICD-10-CM | POA: Diagnosis not present

## 2018-06-01 LAB — HIV ANTIBODY (ROUTINE TESTING W REFLEX): HIV SCREEN 4TH GENERATION: NONREACTIVE

## 2018-06-01 MED ORDER — POTASSIUM CHLORIDE CRYS ER 20 MEQ PO TBCR
40.0000 meq | EXTENDED_RELEASE_TABLET | Freq: Once | ORAL | Status: AC
  Administered 2018-06-01: 40 meq via ORAL
  Filled 2018-06-01: qty 2

## 2018-06-01 MED ORDER — LEVETIRACETAM 250 MG PO TABS: 250.0000 mg | ORAL_TABLET | Freq: Two times a day (BID) | ORAL | 0 refills | Status: DC

## 2018-06-01 NOTE — Discharge Summary (Signed)
Physician Discharge Summary  Sara Hubbard ZOX:[id number] DOB: 12/05/1995 DOA: 05/30/2018  PCP: Richardean Chimera, MD  Admit date: 05/30/2018 Discharge date: 06/01/2018  Admitted From: Home Disposition:  Home   Recommendations for Outpatient Follow-up:  1. Follow up with PCP in 1-2 weeks 2. Please obtain BMP/CBC in one week    Discharge Condition: Stable CODE STATUS: FULL Diet recommendation: Heart Healthy    Brief/Interim Summary: 22 year old female with a history of mild cerebral palsy, seizure disorder, migraine headaches presented with right-sided dysesthesias and dysphasia.  Around 6:30 PM on 05/30/2018, the patient developed a bifrontal headache with associated diaphoresis and some dizziness.  The patient subsequently sat down because she was not feeling well and began experiencing some numbness on her right arm and right leg.  Subsequently, the patient sat on the floor, but did not fall or lose consciousness.  Her friend subsequently arrived and helped her sit up in the chair at which time she had another episode of dizziness.  Subsequently, the patient described but subsequently happened as a "blur".  She remembered EMS arrival, but had trouble getting her words out.  The patient had denied any new medications or over-the-counter medications.  She denies any fevers, chills, chest pain, shortness breath, nausea, vomiting, diarrhea.  In the emergency department, the patient was given a headache cocktail with improvement of her headaches and subsequent improvement of her dysphasia and right-sided dysesthesias. The patient was seen by tele-neurology who recommended admission and MRI.  Discharge Diagnoses:  Sensory disturbance/Dysphasia -suspect complicated migraine vs Todd's paralysis vs psychogenic -symptoms improved -MRI brain--neg -EEG--normal -pt has had normal EEGs on 01/14/14, 04/13/15, and 05/31/18  -CTA head and neck--neg -CT brain neg -A1C--5.2 -LDL--82 -PT eval--no follow up  needed -continue ASA -neurology consult--appreciated-->start keppra -UA and UDS neg -topamax may also cause word finding difficulty  Hypokalemia -repleted -check mag--2.2 -repeat BMP  Migraine headache -continue Trokendi XR -continue topamax      Discharge Instructions   Allergies as of 06/01/2018   No Known Allergies     Medication List    TAKE these medications   levETIRAcetam 250 MG tablet Commonly known as:  KEPPRA Take 1 tablet (250 mg total) by mouth 2 (two) times daily. X 6 days.  Then 2 tabs (500mg ) two times daily starting 06/07/18   medroxyPROGESTERone 150 MG/ML injection Commonly known as:  DEPO-PROVERA Inject 150 mg into the muscle every 3 (three) months.   rizatriptan 10 MG disintegrating tablet Commonly known as:  MAXALT-MLT TAKE 1 TABLET BY MOUTH THREE TIMES DAILY AS NEEDED FOR MIGRAINE What changed:    how much to take  how to take this  when to take this  reasons to take this  additional instructions   Topiramate ER 200 MG Cp24 Commonly known as:  TROKENDI XR Take 1 capsule by mouth daily.       No Known Allergies  Consultations:  neurology   Procedures/Studies: Ct Angio Head W Or Wo Contrast  Result Date: 05/31/2018 CLINICAL DATA:  Initial evaluation for acute headache with transient speech impairment. EXAM: CT ANGIOGRAPHY HEAD AND NECK TECHNIQUE: Multidetector CT imaging of the head and neck was performed using the standard protocol during bolus administration of intravenous contrast. Multiplanar CT image reconstructions and MIPs were obtained to evaluate the vascular anatomy. Carotid stenosis measurements (when applicable) are obtained utilizing NASCET criteria, using the distal internal carotid diameter as the denominator. CONTRAST:  ISOVUE-370 IOPAMIDOL (ISOVUE-370) INJECTION 76% COMPARISON:  Prior CT from earlier the same  day. FINDINGS: CTA NECK FINDINGS Aortic arch: Visualized aortic arch of normal caliber with  normal branch pattern. No flow-limiting stenosis about the origin of the great vessels. Visualized subclavian arteries widely patent. Right carotid system: Right common and internal carotid arteries widely patent without stenosis, dissection, or occlusion. Left carotid system: Left common and internal carotid arteries widely patent without stenosis, dissection, or occlusion. Vertebral arteries: Both vertebral arteries arise from the subclavian arteries. Pre foraminal left V1 segment not well assessed due to adjacent venous contamination. Vertebral arteries otherwise widely patent without stenosis, dissection, or occlusion. Skeleton: No acute osseus abnormality. No discrete lytic or blastic osseous lesions. Other neck: No other acute soft tissue abnormality within the neck. Upper chest: Visualized upper chest within normal limits. Review of the MIP images confirms the above findings CTA HEAD FINDINGS Anterior circulation: Internal carotid arteries widely patent to the termini without stenosis. A1 segments patent bilaterally. Left A1 hypoplastic. Normal anterior communicating artery. Anterior cerebral arteries widely patent to their distal aspects. M1 segments widely patent without stenosis. Normal MCA bifurcations. No proximal M2 occlusion. Distal MCA branches well perfused and symmetric. Posterior circulation: Vertebral arteries widely patent to the vertebrobasilar junction. Basilar artery widely patent to its distal aspect. Superior cerebral arteries patent bilaterally. Fetal type origin of the left PCA supplied via a prominent and widely patent left posterior communicating artery. Hypoplastic right P1 with prominent right posterior communicating artery as well. PCAs widely patent to their distal aspects. Venous sinuses: Patent. Anatomic variants: Fetal type origin of the left PCA. Hypoplastic left P1. No intracranial aneurysm or other vascular abnormality. Delayed phase: No pathologic enhancement. Review of the  MIP images confirms the above findings IMPRESSION: Normal CTA of the head and neck. No large vessel occlusion. No hemodynamically significant or correctable stenosis. Electronically Signed   By: Rise Mu M.D.   On: 05/31/2018 01:34   Ct Angio Neck W And/or Wo Contrast  Result Date: 05/31/2018 CLINICAL DATA:  Initial evaluation for acute headache with transient speech impairment. EXAM: CT ANGIOGRAPHY HEAD AND NECK TECHNIQUE: Multidetector CT imaging of the head and neck was performed using the standard protocol during bolus administration of intravenous contrast. Multiplanar CT image reconstructions and MIPs were obtained to evaluate the vascular anatomy. Carotid stenosis measurements (when applicable) are obtained utilizing NASCET criteria, using the distal internal carotid diameter as the denominator. CONTRAST:  ISOVUE-370 IOPAMIDOL (ISOVUE-370) INJECTION 76% COMPARISON:  Prior CT from earlier the same day. FINDINGS: CTA NECK FINDINGS Aortic arch: Visualized aortic arch of normal caliber with normal branch pattern. No flow-limiting stenosis about the origin of the great vessels. Visualized subclavian arteries widely patent. Right carotid system: Right common and internal carotid arteries widely patent without stenosis, dissection, or occlusion. Left carotid system: Left common and internal carotid arteries widely patent without stenosis, dissection, or occlusion. Vertebral arteries: Both vertebral arteries arise from the subclavian arteries. Pre foraminal left V1 segment not well assessed due to adjacent venous contamination. Vertebral arteries otherwise widely patent without stenosis, dissection, or occlusion. Skeleton: No acute osseus abnormality. No discrete lytic or blastic osseous lesions. Other neck: No other acute soft tissue abnormality within the neck. Upper chest: Visualized upper chest within normal limits. Review of the MIP images confirms the above findings CTA HEAD FINDINGS  Anterior circulation: Internal carotid arteries widely patent to the termini without stenosis. A1 segments patent bilaterally. Left A1 hypoplastic. Normal anterior communicating artery. Anterior cerebral arteries widely patent to their distal aspects. M1 segments widely patent without stenosis.  Normal MCA bifurcations. No proximal M2 occlusion. Distal MCA branches well perfused and symmetric. Posterior circulation: Vertebral arteries widely patent to the vertebrobasilar junction. Basilar artery widely patent to its distal aspect. Superior cerebral arteries patent bilaterally. Fetal type origin of the left PCA supplied via a prominent and widely patent left posterior communicating artery. Hypoplastic right P1 with prominent right posterior communicating artery as well. PCAs widely patent to their distal aspects. Venous sinuses: Patent. Anatomic variants: Fetal type origin of the left PCA. Hypoplastic left P1. No intracranial aneurysm or other vascular abnormality. Delayed phase: No pathologic enhancement. Review of the MIP images confirms the above findings IMPRESSION: Normal CTA of the head and neck. No large vessel occlusion. No hemodynamically significant or correctable stenosis. Electronically Signed   By: Rise Mu M.D.   On: 05/31/2018 01:34   Mr Brain Wo Contrast  Result Date: 05/31/2018 CLINICAL DATA:  Passed out on floor yesterday with weakness and headache. EXAM: MRI HEAD WITHOUT CONTRAST TECHNIQUE: Multiplanar, multiecho pulse sequences of the brain and surrounding structures were obtained without intravenous contrast. COMPARISON:  CTA head neck from earlier today.  Brain MRI 01/29/2014 FINDINGS: Brain: No acute infarction, hemorrhage, hydrocephalus, extra-axial collection or mass lesion. Pituitary has an upward convex appearance on sagittal T1 weighted imaging but is within normal limits at craniocaudal measurement (8 mm) and is homogeneous in signal. This appearance is likely related to  shallow bony sella. Two small FLAIR hyperintensities in the corpus callosum and right frontal white matter from nonspecific remote insult, stable from 2015. Normal brain volume. Vascular: Major flow voids are preserved. Skull and upper cervical spine: No evidence of marrow lesion. Sinuses/Orbits: Mild mucosal thickening in the paranasal sinuses. IMPRESSION: No acute finding or explanation for symptoms. Electronically Signed   By: Marnee Spring M.D.   On: 05/31/2018 09:12   Ct Head Code Stroke Wo Contrast  Result Date: 05/31/2018 CLINICAL DATA:  Code stroke. Initial evaluation for acute altered mental status, aphasia. EXAM: CT HEAD WITHOUT CONTRAST TECHNIQUE: Contiguous axial images were obtained from the base of the skull through the vertex without intravenous contrast. COMPARISON:  None. FINDINGS: Brain: Cerebral volume within normal limits for patient age. No evidence for acute intracranial hemorrhage. No findings to suggest acute large vessel territory infarct. No mass lesion, midline shift, or mass effect. Ventricles are normal in size without evidence for hydrocephalus. No extra-axial fluid collection identified. Vascular: No hyperdense vessel identified. Skull: Scalp soft tissues demonstrate no acute abnormality. Calvarium intact. Sinuses/Orbits: Globes and orbital soft tissues within normal limits. Air-fluid level noted within the right sphenoid sinus. Paranasal sinuses are otherwise clear. No mastoid effusion. ASPECTS [facility] Stroke Program Early CT Score) - Ganglionic level infarction (caudate, lentiform nuclei, internal capsule, insula, M1-M3 cortex): 7 - Supraganglionic infarction (M4-M6 cortex): 3 Total score (0-10 with 10 being normal): 10 IMPRESSION: 1. No acute intracranial abnormality. 2. ASPECTS is 10. 3. Acute right sphenoid sinusitis. Critical Value/emergent results were called by telephone at the time of interpretation on 05/31/2018 at 12:01 am to Dr. Glynn Octave , who verbally  acknowledged these results. Electronically Signed   By: Rise Mu M.D.   On: 05/31/2018 00:03         Discharge Exam: Vitals:   06/01/18 0237 06/01/18 0626  BP: 105/65 (!) 98/56  Pulse: 63 84  Resp:    Temp: 98.4 F (36.9 C) 98.2 F (36.8 C)  SpO2: 97% 98%   Vitals:   05/31/18 2054 05/31/18 2236 06/01/18 3244 06/01/18 0102  BP:  (!) 105/53 105/65 (!) 98/56  Pulse:  86 63 84  Resp:      Temp:  98.6 F (37 C) 98.4 F (36.9 C) 98.2 F (36.8 C)  TempSrc:  Oral Oral Oral  SpO2: 98% 100% 97% 98%  Weight:      Height:        General: Pt is alert, awake, not in acute distress Cardiovascular: RRR, S1/S2 +, no rubs, no gallops Respiratory: CTA bilaterally, no wheezing, no rhonchi Abdominal: Soft, NT, ND, bowel sounds + Extremities: no edema, no cyanosis   The results of significant diagnostics from this hospitalization (including imaging, microbiology, ancillary and laboratory) are listed below for reference.    Significant Diagnostic Studies: Ct Angio Head W Or Wo Contrast  Result Date: 05/31/2018 CLINICAL DATA:  Initial evaluation for acute headache with transient speech impairment. EXAM: CT ANGIOGRAPHY HEAD AND NECK TECHNIQUE: Multidetector CT imaging of the head and neck was performed using the standard protocol during bolus administration of intravenous contrast. Multiplanar CT image reconstructions and MIPs were obtained to evaluate the vascular anatomy. Carotid stenosis measurements (when applicable) are obtained utilizing NASCET criteria, using the distal internal carotid diameter as the denominator. CONTRAST:  ISOVUE-370 IOPAMIDOL (ISOVUE-370) INJECTION 76% COMPARISON:  Prior CT from earlier the same day. FINDINGS: CTA NECK FINDINGS Aortic arch: Visualized aortic arch of normal caliber with normal branch pattern. No flow-limiting stenosis about the origin of the great vessels. Visualized subclavian arteries widely patent. Right carotid system: Right  common and internal carotid arteries widely patent without stenosis, dissection, or occlusion. Left carotid system: Left common and internal carotid arteries widely patent without stenosis, dissection, or occlusion. Vertebral arteries: Both vertebral arteries arise from the subclavian arteries. Pre foraminal left V1 segment not well assessed due to adjacent venous contamination. Vertebral arteries otherwise widely patent without stenosis, dissection, or occlusion. Skeleton: No acute osseus abnormality. No discrete lytic or blastic osseous lesions. Other neck: No other acute soft tissue abnormality within the neck. Upper chest: Visualized upper chest within normal limits. Review of the MIP images confirms the above findings CTA HEAD FINDINGS Anterior circulation: Internal carotid arteries widely patent to the termini without stenosis. A1 segments patent bilaterally. Left A1 hypoplastic. Normal anterior communicating artery. Anterior cerebral arteries widely patent to their distal aspects. M1 segments widely patent without stenosis. Normal MCA bifurcations. No proximal M2 occlusion. Distal MCA branches well perfused and symmetric. Posterior circulation: Vertebral arteries widely patent to the vertebrobasilar junction. Basilar artery widely patent to its distal aspect. Superior cerebral arteries patent bilaterally. Fetal type origin of the left PCA supplied via a prominent and widely patent left posterior communicating artery. Hypoplastic right P1 with prominent right posterior communicating artery as well. PCAs widely patent to their distal aspects. Venous sinuses: Patent. Anatomic variants: Fetal type origin of the left PCA. Hypoplastic left P1. No intracranial aneurysm or other vascular abnormality. Delayed phase: No pathologic enhancement. Review of the MIP images confirms the above findings IMPRESSION: Normal CTA of the head and neck. No large vessel occlusion. No hemodynamically significant or correctable  stenosis. Electronically Signed   By: Rise Mu M.D.   On: 05/31/2018 01:34   Ct Angio Neck W And/or Wo Contrast  Result Date: 05/31/2018 CLINICAL DATA:  Initial evaluation for acute headache with transient speech impairment. EXAM: CT ANGIOGRAPHY HEAD AND NECK TECHNIQUE: Multidetector CT imaging of the head and neck was performed using the standard protocol during bolus administration of intravenous contrast. Multiplanar CT image reconstructions and  MIPs were obtained to evaluate the vascular anatomy. Carotid stenosis measurements (when applicable) are obtained utilizing NASCET criteria, using the distal internal carotid diameter as the denominator. CONTRAST:  ISOVUE-370 IOPAMIDOL (ISOVUE-370) INJECTION 76% COMPARISON:  Prior CT from earlier the same day. FINDINGS: CTA NECK FINDINGS Aortic arch: Visualized aortic arch of normal caliber with normal branch pattern. No flow-limiting stenosis about the origin of the great vessels. Visualized subclavian arteries widely patent. Right carotid system: Right common and internal carotid arteries widely patent without stenosis, dissection, or occlusion. Left carotid system: Left common and internal carotid arteries widely patent without stenosis, dissection, or occlusion. Vertebral arteries: Both vertebral arteries arise from the subclavian arteries. Pre foraminal left V1 segment not well assessed due to adjacent venous contamination. Vertebral arteries otherwise widely patent without stenosis, dissection, or occlusion. Skeleton: No acute osseus abnormality. No discrete lytic or blastic osseous lesions. Other neck: No other acute soft tissue abnormality within the neck. Upper chest: Visualized upper chest within normal limits. Review of the MIP images confirms the above findings CTA HEAD FINDINGS Anterior circulation: Internal carotid arteries widely patent to the termini without stenosis. A1 segments patent bilaterally. Left A1 hypoplastic. Normal  anterior communicating artery. Anterior cerebral arteries widely patent to their distal aspects. M1 segments widely patent without stenosis. Normal MCA bifurcations. No proximal M2 occlusion. Distal MCA branches well perfused and symmetric. Posterior circulation: Vertebral arteries widely patent to the vertebrobasilar junction. Basilar artery widely patent to its distal aspect. Superior cerebral arteries patent bilaterally. Fetal type origin of the left PCA supplied via a prominent and widely patent left posterior communicating artery. Hypoplastic right P1 with prominent right posterior communicating artery as well. PCAs widely patent to their distal aspects. Venous sinuses: Patent. Anatomic variants: Fetal type origin of the left PCA. Hypoplastic left P1. No intracranial aneurysm or other vascular abnormality. Delayed phase: No pathologic enhancement. Review of the MIP images confirms the above findings IMPRESSION: Normal CTA of the head and neck. No large vessel occlusion. No hemodynamically significant or correctable stenosis. Electronically Signed   By: Rise Mu M.D.   On: 05/31/2018 01:34   Mr Brain Wo Contrast  Result Date: 05/31/2018 CLINICAL DATA:  Passed out on floor yesterday with weakness and headache. EXAM: MRI HEAD WITHOUT CONTRAST TECHNIQUE: Multiplanar, multiecho pulse sequences of the brain and surrounding structures were obtained without intravenous contrast. COMPARISON:  CTA head neck from earlier today.  Brain MRI 01/29/2014 FINDINGS: Brain: No acute infarction, hemorrhage, hydrocephalus, extra-axial collection or mass lesion. Pituitary has an upward convex appearance on sagittal T1 weighted imaging but is within normal limits at craniocaudal measurement (8 mm) and is homogeneous in signal. 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 Two small FLAIR hyperintensities in the corpus callosum and right frontal white matter from nonspecific remote insult, stable from 2015.  Normal brain volume. Vascular: Major flow voids are preserved. Skull and upper cervical spine: No evidence of marrow lesion. Sinuses/Orbits: Mild mucosal thickening in the paranasal sinuses. IMPRESSION: No acute finding or explanation for symptoms. Electronically Signed   By: Marnee Spring M.D.   On: 05/31/2018 09:12   Ct Head Code Stroke Wo Contrast  Result Date: 05/31/2018 CLINICAL DATA:  Code stroke. Initial evaluation for acute altered mental status, aphasia. EXAM: CT HEAD WITHOUT CONTRAST TECHNIQUE: Contiguous axial images were obtained from the base of the skull through the vertex without intravenous contrast. COMPARISON:  None. FINDINGS: Brain: Cerebral volume within normal limits for patient age. No evidence for acute  intracranial hemorrhage. No findings to suggest acute large vessel territory infarct. No mass lesion, midline shift, or mass effect. Ventricles are normal in size without evidence for hydrocephalus. No extra-axial fluid collection identified. Vascular: No hyperdense vessel identified. Skull: Scalp soft tissues demonstrate no acute abnormality. Calvarium intact. Sinuses/Orbits: Globes and orbital soft tissues within normal limits. Air-fluid level noted within the right sphenoid sinus. Paranasal sinuses are otherwise clear. No mastoid effusion. ASPECTS [facility] Stroke Program Early CT Score) - Ganglionic level infarction (caudate, lentiform nuclei, internal capsule, insula, M1-M3 cortex): 7 - Supraganglionic infarction (M4-M6 cortex): 3 Total score (0-10 with 10 being normal): 10 IMPRESSION: 1. No acute intracranial abnormality. 2. ASPECTS is 10. 3. Acute right sphenoid sinusitis. Critical Value/emergent results were called by telephone at the time of interpretation on 05/31/2018 at 12:01 am to Dr. Glynn Octave , who verbally acknowledged these results. Electronically Signed   By: Rise Mu M.D.   On: 05/31/2018 00:03     Microbiology: No results found for this or any  previous visit (from the past 240 hour(s)).   Labs: Basic Metabolic Panel: Recent Labs  Lab 05/30/18 2319 05/31/18 0507  NA 139 138  K 3.2* 3.0*  CL 112* 111  CO2 19* 18*  GLUCOSE 91 84  BUN 10 10  CREATININE 0.71 0.66  CALCIUM 9.2 8.9  MG  --  2.2   Liver Function Tests: Recent Labs  Lab 05/30/18 2319  AST 13*  ALT 10  ALKPHOS 57  BILITOT 0.5  PROT 7.1  ALBUMIN 4.1   No results for input(s): LIPASE, AMYLASE in the last 168 hours. No results for input(s): AMMONIA in the last 168 hours. CBC: Recent Labs  Lab 05/30/18 2319  WBC 12.3*  NEUTROABS 7.9*  8.2*  HGB 14.2  HCT 41.2  MCV 91.2  PLT 241   Cardiac Enzymes: Recent Labs  Lab 05/30/18 2319  TROPONINI <0.03   BNP: Invalid input(s): POCBNP CBG: No results for input(s): GLUCAP in the last 168 hours.  Time coordinating discharge:  36 minutes  Signed:  Catarina Hartshorn, DO [facility] Pager: [phone] 06/01/2018, 9:27 AM

## 2018-06-01 NOTE — Progress Notes (Signed)
Patient is to be discharged home and in stable condition. Patient's IV and telemetry removed, WNL. Patient give discharge instructions and verbalized understanding. Patient awaiting transportation and will be escorted out by staff via wheelchair upon sister's arrival.  Quita Skye, RN

## 2018-06-04 ENCOUNTER — Encounter: Payer: Self-pay | Admitting: Adult Health

## 2018-06-04 ENCOUNTER — Ambulatory Visit: Payer: Medicaid Other | Admitting: Adult Health

## 2018-06-04 VITALS — BP 125/76 | HR 84 | Ht 64.0 in | Wt 200.6 lb

## 2018-06-04 DIAGNOSIS — G43009 Migraine without aura, not intractable, without status migrainosus: Secondary | ICD-10-CM | POA: Diagnosis not present

## 2018-06-04 NOTE — Progress Notes (Signed)
PATIENT: Sara Hubbard DOB: 1996-05-18  REASON FOR VISIT: follow up HISTORY FROM: patient  HISTORY OF PRESENT ILLNESS: Today 06/04/18:  Sara Hubbard is a 22 year old female with a history of migraine headaches.  She returns today for hospital follow-up.  On 9-25 she went to the hospital after suffering a severe headache with diaphoresis and then developing numbness on the right side as well as word finding difficulty.  She was admitted to the hospital and had a complete work-up.  MRI of the brain was unremarkable, CTA of the head and neck was normal.  EEG was normal.  The patient was thought to have a complex migraine or seizure event.  She was started empirically on Keppra.  She states that she has not had any additional events since being home.  In the past she had issues with word finding typically when she had a headache.  She denies any convulsing in the extremities, denies loss of consciousness denies loss of bowel or bladder during the event.  She does report that she has been under more stress.  Reports that until this event her migraines have been under relatively good control with Trokendi.  She returns today for evaluation.   REVIEW OF SYSTEMS: Out of a complete 14 system review of symptoms, the patient complains only of the following symptoms, and all other reviewed systems are negative.  ALLERGIES: No Known Allergies  HOME MEDICATIONS: Outpatient Medications Prior to Visit  Medication Sig Dispense Refill  . levETIRAcetam (KEPPRA) 250 MG tablet Take 1 tablet (250 mg total) by mouth 2 (two) times daily. X 6 days.  Then 2 tabs (500mg ) two times daily starting 06/07/18 120 tablet 0  . medroxyPROGESTERone (DEPO-PROVERA) 150 MG/ML injection Inject 150 mg into the muscle every 3 (three) months.     . rizatriptan (MAXALT-MLT) 10 MG disintegrating tablet TAKE 1 TABLET BY MOUTH THREE TIMES DAILY AS NEEDED FOR MIGRAINE (Patient taking differently: Take 10 mg by mouth 3 (three) times daily as  needed for migraine. ) 9 tablet 3  . Topiramate ER (TROKENDI XR) 200 MG CP24 Take 1 capsule by mouth daily. 90 capsule 3   No facility-administered medications prior to visit.     PAST MEDICAL HISTORY: Past Medical History:  Diagnosis Date  . Cerebral palsy (HCC)   . Common migraine 08/23/2016  . Disturbance of skin sensation 12/30/2013  . Headache(784.0) 12/30/2013  . Migraine   . Pain in limb 12/30/2013  . Syncope     PAST SURGICAL HISTORY: Past Surgical History:  Procedure Laterality Date  . EYE SURGERY    . TOOTH EXTRACTION      FAMILY HISTORY: Family History  Problem Relation Age of Onset  . Hypertension Father   . Diabetes Maternal Grandmother   . Diabetes Paternal Grandmother   . Seizures Paternal Grandmother   . Diabetes Mother   . Seizures Paternal Grandfather     SOCIAL HISTORY: Social History   Socioeconomic History  . Marital status: Significant Other    Spouse name: Not on file  . Number of children: 0  . Years of education: 12th  . Highest education level: Not on file  Occupational History  . Occupation: Consulting civil engineer  Social Needs  . Financial resource strain: Not on file  . Food insecurity:    Worry: Not on file    Inability: Not on file  . Transportation needs:    Medical: Not on file    Non-medical: Not on file  Tobacco Use  .  Smoking status: Never Smoker  . Smokeless tobacco: Never Used  Substance and Sexual Activity  . Alcohol use: No    Alcohol/week: 0.0 standard drinks  . Drug use: No  . Sexual activity: Not Currently    Birth control/protection: Injection  Lifestyle  . Physical activity:    Days per week: Not on file    Minutes per session: Not on file  . Stress: Not on file  Relationships  . Social connections:    Talks on phone: Not on file    Gets together: Not on file    Attends religious service: Not on file    Active member of club or organization: Not on file    Attends meetings of clubs or organizations: Not on file     Relationship status: Not on file  . Intimate partner violence:    Fear of current or ex partner: No    Emotionally abused: No    Physically abused: No    Forced sexual activity: No  Other Topics Concern  . Not on file  Social History Narrative   Patient is single with no children.   Patient is right handed.   Patient has college education.   Patient drinks 1 cup daily.      PHYSICAL EXAM  Vitals:   06/04/18 1107  BP: 125/76  Pulse: 84  Weight: 200 lb 9.6 oz (91 kg)  Height: 5\' 4"  (1.626 m)   Body mass index is 34.43 kg/m.  Generalized: Well developed, in no acute distress   Neurological examination  Mentation: Alert oriented to time, place, history taking. Follows all commands speech and language fluent Cranial nerve II-XII: Pupils were equal round reactive to light.  Facial sensation and strength were normal. Uvula tongue midline. Head turning and shoulder shrug  were normal and symmetric. Motor: The motor testing reveals 5 over 5 strength of all 4 extremities. Good symmetric motor tone is noted throughout.  Sensory: Sensory testing is intact to soft touch on all 4 extremities. No evidence of extinction is noted.  Coordination: Cerebellar testing reveals good finger-nose-finger and heel-to-shin bilaterally.  Gait and station: Patient has a slight limp when ambulating.  Tandem gait is slightly unsteady. Reflexes: Deep tendon reflexes are symmetric and normal bilaterally.   DIAGNOSTIC DATA (LABS, IMAGING, TESTING) - I reviewed patient records, labs, notes, testing and imaging myself where available.  Lab Results  Component Value Date   WBC 12.3 (H) 05/30/2018   HGB 14.2 05/30/2018   HCT 41.2 05/30/2018   MCV 91.2 05/30/2018   PLT 241 05/30/2018      Component Value Date/Time   NA 138 05/31/2018 0507   K 3.0 (L) 05/31/2018 0507   CL 111 05/31/2018 0507   CO2 18 (L) 05/31/2018 0507   GLUCOSE 84 05/31/2018 0507   BUN 10 05/31/2018 0507   CREATININE 0.66  05/31/2018 0507   CALCIUM 8.9 05/31/2018 0507   PROT 7.1 05/30/2018 2319   ALBUMIN 4.1 05/30/2018 2319   AST 13 (L) 05/30/2018 2319   ALT 10 05/30/2018 2319   ALKPHOS 57 05/30/2018 2319   BILITOT 0.5 05/30/2018 2319   GFRNONAA >60 05/31/2018 0507   GFRAA >60 05/31/2018 0507   Lab Results  Component Value Date   CHOL 141 05/31/2018   HDL 50 05/31/2018   LDLCALC 82 05/31/2018   TRIG 44 05/31/2018   CHOLHDL 2.8 05/31/2018   Lab Results  Component Value Date   HGBA1C 5.2 05/30/2018      ASSESSMENT  AND PLAN 22 y.o. year old female  has a past medical history of Cerebral palsy (HCC), Common migraine (08/23/2016), Disturbance of skin sensation (12/30/2013), Headache(784.0) (12/30/2013), Migraine, Pain in limb (12/30/2013), and Syncope. here with:  1.  Migraine headache  I discussed with Dr. Anne Hahn who agreed that this sounded like a migraine event versus a seizure.  The patient will be weaned off of Keppra.  Reducing her dose to 1 tablet daily for the next 4 to 5 days and discontinuing the medication.  Patient can resume operating a motor vehicle.  If she has any similar events she will let us know.  She will continue on Trokendi.  She will follow-up in 3 to 4 months or sooner if needed.    Butch Penny, MSN, NP-C 06/04/2018, 11:04 AM [facility] [address], [address] [place], [place] [address] [phone]

## 2018-06-04 NOTE — Progress Notes (Signed)
I have read the note, and I agree with the clinical assessment and plan.  Sara Hubbard   

## 2018-06-04 NOTE — Patient Instructions (Signed)
Your Plan:  Continue Keppra Continue Trokendi If you have any word finding events- keep a log and let us know If your symptoms worsen or you develop new symptoms please let us know.    Thank you for coming to see [place] at [facility]. I hope we have been able to provide you high quality care today.  You may receive a patient satisfaction survey over the next few weeks. We would appreciate your feedback and comments so that we may continue to improve ourselves and the health of our patients.

## 2018-06-18 DIAGNOSIS — Z0271 Encounter for disability determination: Secondary | ICD-10-CM

## 2018-09-06 ENCOUNTER — Ambulatory Visit: Payer: Medicaid Other | Admitting: Adult Health

## 2018-09-06 ENCOUNTER — Telehealth: Payer: Self-pay | Admitting: *Deleted

## 2018-09-06 NOTE — Telephone Encounter (Signed)
Patient called office at 8:06 am today and stated she needed to cancel her 8:00 am follow up today due to fever.

## 2018-09-10 ENCOUNTER — Encounter: Payer: Self-pay | Admitting: Adult Health

## 2018-10-10 DIAGNOSIS — Z0271 Encounter for disability determination: Secondary | ICD-10-CM

## 2018-10-22 ENCOUNTER — Encounter: Payer: Self-pay | Admitting: Neurology

## 2018-10-22 ENCOUNTER — Ambulatory Visit: Payer: Medicaid Other | Admitting: Neurology

## 2018-10-22 VITALS — BP 108/66 | HR 76 | Ht 64.0 in | Wt 201.2 lb

## 2018-10-22 DIAGNOSIS — G43009 Migraine without aura, not intractable, without status migrainosus: Secondary | ICD-10-CM

## 2018-10-22 DIAGNOSIS — R252 Cramp and spasm: Secondary | ICD-10-CM

## 2018-10-22 MED ORDER — RIZATRIPTAN BENZOATE 10 MG PO TBDP: ORAL_TABLET | ORAL | 3 refills | Status: DC

## 2018-10-22 MED ORDER — TOPIRAMATE ER 200 MG PO CAP24: 1.0000 | ORAL_CAPSULE | Freq: Every day | ORAL | 3 refills | Status: DC

## 2018-10-22 NOTE — Progress Notes (Signed)
I have read the note, and I agree with the clinical assessment and plan.  Zyrell Carmean K Taven Strite   

## 2018-10-22 NOTE — Patient Instructions (Signed)
Start taking over the counter magnesium supplement for your muscle cramps. Try for 2- 3 weeks If no better, call and we could consider low-dose baclofen.

## 2018-10-22 NOTE — Progress Notes (Signed)
PATIENT: Sara Hubbard DOB: 07/27/1996  REASON FOR VISIT: follow up HISTORY FROM: patient  HISTORY OF PRESENT ILLNESS: Today 10/22/18   [name] is a 23 year old female who presents for follow-up for migraine and possible seizures.  She is currently taking Trokendi XR 200 mg daily.  She is tolerating the medication well.  She uses Maxalt as needed. In September 2019, she was weaned off Keppra, after she was put on it for what was thought to be seizures, but was more consistent with a migraine headache.   She reports that she is having about 3 migraines a month.  She reports that she is back in school and that her headache frequency is directly related to her stress level.  She reports that some months she will have no headaches.  Of the headache, she describes it as starting in between her eyes and then spreading to her entire head.  She reports that she will take 1 tablet of Maxalt early on and get relief within about 1 hour.  She denies any seizure events.  She reports that her last seizure was 1.5 years ago.  She describes her seizure episodes as events where she will stare off and that she is aware of the events before they happen.  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  She reports that she has been having muscle spasms diffusely throughout her body that are worse at night.  She reports that she will feel knots in her muscles and that the spasms commonly occur in her left shoulder blade and in her calves.  She does have mild cerebral palsy and her primary doctor mentioned this could be a component.  She reports that over the last few months her spasms have become more frequent and are most bothersome at nighttime.  She presents today for follow-up unaccompanied.  HISTORY 06/04/2018 MM [name] is a 23 year old female with a history of migraine headaches. She returns today for hospital follow-up.  On 9-25 she went to the hospital after suffering a severe headache with  diaphoresis and then developing numbness on the right side as well as word finding difficulty.  She was admitted to the hospital and had a complete work-up.  MRI of the brain was unremarkable, CTA of the head and neck was normal.  EEG was normal.  The patient was thought to have a complex migraine or seizure event.  She was started empirically on Keppra.  She states that she has not had any additional events since being home.  In the past she had issues with word finding typically when she had a headache.  She denies any convulsing in the extremities, denies loss of consciousness denies loss of bowel or bladder during the event.  She does report that she has been under more stress.  Reports that until this event her migraines have been under relatively good control with Trokendi.  She returns today for evaluation.  February 26, 2018 MM [name] Is a 23 year old female with a history of cerebral palsy and migraine headaches.  Reports that she is doing well.  He has approximately 1 migraine a month.  She remains on Trokendi 200 mg daily.  She reports that she continues to take Maxalt with good benefit.  She is currently taking Depo shot for birth control.  She states that she is considering other birth control method.  She reports that she did run out of Trokendi for several days.  She reports that she  did have some breakthrough headaches during that time.  She returns today for follow-up.   REVIEW OF SYSTEMS: Out of a complete 14 system review of symptoms, the patient complains only of the following symptoms, and all other reviewed systems are negative.  Muscle cramps  ALLERGIES: No Known Allergies  HOME MEDICATIONS: Outpatient Medications Prior to Visit  Medication Sig Dispense Refill  . medroxyPROGESTERone (DEPO-PROVERA) 150 MG/ML injection Inject 150 mg into the muscle every 3 (three) months.     . rizatriptan (MAXALT-MLT) 10 MG disintegrating tablet TAKE 1 TABLET BY MOUTH THREE TIMES DAILY AS NEEDED  FOR MIGRAINE (Patient taking differently: Take 10 mg by mouth 3 (three) times daily as needed for migraine. ) 9 tablet 3  . Topiramate ER (TROKENDI XR) 200 MG CP24 Take 1 capsule by mouth daily. 90 capsule 3  . levETIRAcetam (KEPPRA) 250 MG tablet Take 1 tablet (250 mg total) by mouth 2 (two) times daily. X 6 days.  Then 2 tabs (500mg ) two times daily starting 06/07/18 (Patient not taking: Reported on 10/22/2018) 120 tablet 0   No facility-administered medications prior to visit.     PAST MEDICAL HISTORY: Past Medical History:  Diagnosis Date  . Cerebral palsy (HCC)   . Common migraine 08/23/2016  . Disturbance of skin sensation 12/30/2013  . Headache(784.0) 12/30/2013  . Migraine   . Pain in limb 12/30/2013  . Syncope     PAST SURGICAL HISTORY: Past Surgical History:  Procedure Laterality Date  . EYE SURGERY    . TOOTH EXTRACTION      FAMILY HISTORY: Family History  Problem Relation Age of Onset  . Hypertension Father   . Diabetes Maternal Grandmother   . Diabetes Paternal Grandmother   . Seizures Paternal Grandmother   . Diabetes Mother   . Seizures Paternal Grandfather     SOCIAL HISTORY: Social History   Socioeconomic History  . Marital status: Significant Other    Spouse name: Not on file  . Number of children: 0  . Years of education: 12th  . Highest education level: Not on file  Occupational History  . Occupation: Consulting civil engineer  Social Needs  . Financial resource strain: Not on file  . Food insecurity:    Worry: Not on file    Inability: Not on file  . Transportation needs:    Medical: Not on file    Non-medical: Not on file  Tobacco Use  . Smoking status: Never Smoker  . Smokeless tobacco: Never Used  Substance and Sexual Activity  . Alcohol use: No    Alcohol/week: 0.0 standard drinks  . Drug use: No  . Sexual activity: Not Currently    Birth control/protection: Injection  Lifestyle  . Physical activity:    Days per week: Not on file    Minutes per  session: Not on file  . Stress: Not on file  Relationships  . Social connections:    Talks on phone: Not on file    Gets together: Not on file    Attends religious service: Not on file    Active member of club or organization: Not on file    Attends meetings of clubs or organizations: Not on file    Relationship status: Not on file  . Intimate partner violence:    Fear of current or ex partner: No    Emotionally abused: No    Physically abused: No    Forced sexual activity: No  Other Topics Concern  . Not on file  Social History Narrative   Patient is single with no children.   Patient is right handed.   Patient has college education.   Patient drinks 1 cup daily.      PHYSICAL EXAM  Vitals:   10/22/18 1013  BP: 108/66  Pulse: 76  Weight: 201 lb 3.2 oz (91.3 kg)  Height: 5\' 4"  (1.626 m)   Body mass index is 34.54 kg/m.  Generalized: Well developed, in no acute distress   Neurological examination  Mentation: Alert oriented to time, place, history taking. Follows all commands speech and language fluent Cranial nerve II-XII: Pupils were equal round reactive to light. Extraocular movements were full, visual field were full on confrontational test. Facial sensation and strength were normal. Uvula tongue midline. Head turning and shoulder shrug  were normal and symmetric. Motor: The motor testing reveals 4 over 5 strength of all 4 extremities. Good symmetric motor tone is noted throughout.  Sensory: Sensory testing is intact to soft touch on all 4 extremities. No evidence of extinction is noted.  Coordination: Cerebellar testing reveals good finger-nose-finger and heel-to-shin bilaterally.  Gait and station: Gait is normal. Tandem gait is normal. Romberg is negative. No drift is seen.  Reflexes: Deep tendon reflexes are symmetric and normal bilaterally.   DIAGNOSTIC DATA (LABS, IMAGING, TESTING) - I reviewed patient records, labs, notes, testing and imaging myself where  available.  Lab Results  Component Value Date   WBC 12.3 (H) 05/30/2018   HGB 14.2 05/30/2018   HCT 41.2 05/30/2018   MCV 91.2 05/30/2018   PLT 241 05/30/2018      Component Value Date/Time   NA 138 05/31/2018 0507   K 3.0 (L) 05/31/2018 0507   CL 111 05/31/2018 0507   CO2 18 (L) 05/31/2018 0507   GLUCOSE 84 05/31/2018 0507   BUN 10 05/31/2018 0507   CREATININE 0.66 05/31/2018 0507   CALCIUM 8.9 05/31/2018 0507   PROT 7.1 05/30/2018 2319   ALBUMIN 4.1 05/30/2018 2319   AST 13 (L) 05/30/2018 2319   ALT 10 05/30/2018 2319   ALKPHOS 57 05/30/2018 2319   BILITOT 0.5 05/30/2018 2319   GFRNONAA >60 05/31/2018 0507   GFRAA >60 05/31/2018 0507   Lab Results  Component Value Date   CHOL 141 05/31/2018   HDL 50 05/31/2018   LDLCALC 82 05/31/2018   TRIG 44 05/31/2018   CHOLHDL 2.8 05/31/2018   Lab Results  Component Value Date   HGBA1C 5.2 05/30/2018   No results found for: VITAMINB12 No results found for: TSH    ASSESSMENT AND PLAN 23 y.o. year old female  has a past medical history of Cerebral palsy (HCC), Common migraine (08/23/2016), Disturbance of skin sensation (12/30/2013), Headache(784.0) (12/30/2013), Migraine, Pain in limb (12/30/2013), and Syncope. here with:  1.  Migraine headaches 2.  Muscle cramps 3.  Possible history of seizures   She reports that she is having about 3 headaches per month.  She reports that this is related to stress as she is back in school.  She does report that some months she will have no headaches.  She will continue on her Trokendi XR 200 mg daily.  I advised her that if her headaches continue in frequency or worsen we could add another medication to improve her headaches.  We likely would not increase her Trokendi if headaches are her issue.  Other than Topamax, I don't believe she has been on any other medications for her headaches. She generally likes Trokendi and has good  benefit with it however she identifies stress as being an  aggravating factor for her headache recurrence.  She is taking Depo-Provera for birth control. She will continue to use Maxalt as needed.  She complains of muscle cramps that are worse at night.  She reports she has been having these for months.  She has been advised that her muscle spasms could be a component of her mild cerebral palsy.  I advised her to start by taking a over-the-counter magnesium supplement 200- 400 mg daily.  We discussed that she would try this for 2 to 3 weeks and if she is not seeing benefit we could consider adding low-dose baclofen to take in the evening.  She has not had any seizure events.  She will follow-up in 6 months or sooner if needed.  I advised her that if her symptoms worsen or if she should develop any new symptoms she should let us know.   Margie Ege, AGNP-C, DNP 10/22/2018, 3:59 PM [facility] [address] [place], [place] [address] [phone]

## 2019-02-28 ENCOUNTER — Ambulatory Visit: Payer: Medicaid Other | Admitting: Adult Health

## 2019-02-28 ENCOUNTER — Emergency Department (HOSPITAL_COMMUNITY)
Admission: EM | Admit: 2019-02-28 | Discharge: 2019-02-28 | Disposition: A | Discharge status: Home / Self Care | Payer: Medicaid Other | Attending: Emergency Medicine | Admitting: Emergency Medicine

## 2019-02-28 ENCOUNTER — Other Ambulatory Visit: Payer: Self-pay

## 2019-02-28 ENCOUNTER — Encounter (HOSPITAL_COMMUNITY): Payer: Self-pay

## 2019-02-28 DIAGNOSIS — Z8673 Personal history of transient ischemic attack (TIA), and cerebral infarction without residual deficits: Secondary | ICD-10-CM | POA: Diagnosis not present

## 2019-02-28 DIAGNOSIS — R42 Dizziness and giddiness: Secondary | ICD-10-CM

## 2019-02-28 DIAGNOSIS — Z3202 Encounter for pregnancy test, result negative: Secondary | ICD-10-CM | POA: Diagnosis not present

## 2019-02-28 DIAGNOSIS — Z79899 Other long term (current) drug therapy: Secondary | ICD-10-CM | POA: Insufficient documentation

## 2019-02-28 DIAGNOSIS — G809 Cerebral palsy, unspecified: Secondary | ICD-10-CM | POA: Insufficient documentation

## 2019-02-28 DIAGNOSIS — R519 Headache, unspecified: Secondary | ICD-10-CM

## 2019-02-28 DIAGNOSIS — R569 Unspecified convulsions: Secondary | ICD-10-CM | POA: Diagnosis not present

## 2019-02-28 HISTORY — DX: Unspecified convulsions: R56.9

## 2019-02-28 LAB — CBC WITH DIFFERENTIAL/PLATELET
Abs Immature Granulocytes: 0.02 10*3/uL (ref 0.00–0.07)
Basophils Absolute: 0 10*3/uL (ref 0.0–0.1)
Basophils Relative: 0 %
Eosinophils Absolute: 0.1 10*3/uL (ref 0.0–0.5)
Eosinophils Relative: 1 %
HCT: 40.3 % (ref 36.0–46.0)
Hemoglobin: 13.2 g/dL (ref 12.0–15.0)
Immature Granulocytes: 0 %
Lymphocytes Relative: 30 %
Lymphs Abs: 3.5 10*3/uL (ref 0.7–4.0)
MCH: 29.9 pg (ref 26.0–34.0)
MCHC: 32.8 g/dL (ref 30.0–36.0)
MCV: 91.2 fL (ref 80.0–100.0)
Monocytes Absolute: 0.6 10*3/uL (ref 0.1–1.0)
Monocytes Relative: 5 %
Neutro Abs: 7.6 10*3/uL (ref 1.7–7.7)
Neutrophils Relative %: 64 %
Platelets: 185 10*3/uL (ref 150–400)
RBC: 4.42 MIL/uL (ref 3.87–5.11)
RDW: 12.7 % (ref 11.5–15.5)
WBC: 11.9 10*3/uL — ABNORMAL HIGH (ref 4.0–10.5)
nRBC: 0 % (ref 0.0–0.2)

## 2019-02-28 LAB — COMPREHENSIVE METABOLIC PANEL
ALT: 10 U/L (ref 0–44)
AST: 13 U/L — ABNORMAL LOW (ref 15–41)
Albumin: 4 g/dL (ref 3.5–5.0)
Alkaline Phosphatase: 56 U/L (ref 38–126)
Anion gap: 11 (ref 5–15)
BUN: 16 mg/dL (ref 6–20)
CO2: 16 mmol/L — ABNORMAL LOW (ref 22–32)
Calcium: 8.8 mg/dL — ABNORMAL LOW (ref 8.9–10.3)
Chloride: 112 mmol/L — ABNORMAL HIGH (ref 98–111)
Creatinine, Ser: 0.74 mg/dL (ref 0.44–1.00)
GFR calc Af Amer: >60 mL/min (ref >60)
GFR calc non Af Amer: >60 mL/min (ref >60)
Glucose, Bld: 88 mg/dL (ref 70–99)
Potassium: 3.1 mmol/L — ABNORMAL LOW (ref 3.5–5.1)
Sodium: 139 mmol/L (ref 135–145)
Total Bilirubin: 0.5 mg/dL (ref 0.3–1.2)
Total Protein: 6.8 g/dL (ref 6.5–8.1)

## 2019-02-28 LAB — ETHANOL: Alcohol, Ethyl (B): <10 mg/dL (ref <10)

## 2019-02-28 LAB — POC URINE PREG, ED: Preg Test, Ur: NEGATIVE

## 2019-02-28 LAB — CBG MONITORING, ED: Glucose-Capillary: 80 mg/dL (ref 70–99)

## 2019-02-28 LAB — PHOSPHORUS: Phosphorus: 3.9 mg/dL (ref 2.5–4.6)

## 2019-02-28 LAB — MAGNESIUM: Magnesium: 2.2 mg/dL (ref 1.7–2.4)

## 2019-02-28 MED ORDER — DEXAMETHASONE SODIUM PHOSPHATE 4 MG/ML IJ SOLN
10.0000 mg | Freq: Once | INTRAMUSCULAR | Status: AC
  Administered 2019-02-28: 10 mg via INTRAVENOUS
  Filled 2019-02-28: qty 3

## 2019-02-28 MED ORDER — KETOROLAC TROMETHAMINE 30 MG/ML IJ SOLN
30.0000 mg | Freq: Once | INTRAMUSCULAR | Status: AC
  Administered 2019-02-28: 30 mg via INTRAVENOUS
  Filled 2019-02-28: qty 1

## 2019-02-28 MED ORDER — SODIUM CHLORIDE 0.9 % IV BOLUS
500.0000 mL | Freq: Once | INTRAVENOUS | Status: AC
  Administered 2019-02-28: 500 mL via INTRAVENOUS

## 2019-02-28 MED ORDER — MECLIZINE HCL 25 MG PO TABS: 25.0000 mg | ORAL_TABLET | Freq: Three times a day (TID) | ORAL | 0 refills | Status: AC | PRN

## 2019-02-28 MED ORDER — METOCLOPRAMIDE HCL 5 MG/ML IJ SOLN
10.0000 mg | Freq: Once | INTRAMUSCULAR | Status: AC
  Administered 2019-02-28: 10 mg via INTRAVENOUS
  Filled 2019-02-28: qty 2

## 2019-02-28 MED ORDER — DIAZEPAM 5 MG/ML IJ SOLN
2.5000 mg | Freq: Once | INTRAMUSCULAR | Status: AC
  Administered 2019-02-28: 2.5 mg via INTRAVENOUS
  Filled 2019-02-28: qty 2

## 2019-02-28 NOTE — Discharge Instructions (Signed)
You were seen in the ER for possible seizure-like activity over the weekend and today.  You also reported left-sided headache, dizziness, nausea.  Lab work today are reassuring.  Your symptoms improved with migraine and vertigo medicine.  Given your symptoms, exam and improvement in the ER we will discharge you with meclizine which is a medicine for vertigo that may be causing your dizziness.  Continue all your migraine and seizure medicines prescribed to you by neurology.  Call your neurologist to make an appointment for further discussion of today's symptoms and ER visit.

## 2019-02-28 NOTE — ED Provider Notes (Signed)
[facility] Provider Note   CSN: [id number] Arrival date & time: 02/28/19  1831    History   Chief Complaint Chief Complaint  Patient presents with   Seizures    HPI Sara Hubbard is a 23 y.o. female with h/o migraines on topiramate and Maxalt, cerebral palsy presents to ER for evaluation of possible seizures.  She had a seizure on Friday while she was on the toilet, she felt prodromal dizziness, nausea, hot flashes and word finding difficulty, she started staring off into space and couldn't talk.  She felt like she was going to fall over and called for help, had to lean on somebody to get up. She describes that seizure and "staring off into space" and not being able to talk.  She was awake during the episode. There was no shaking, incontinence or tongue biting.  This seizure was typical of her previous seizures in the past where she stares off in the distance, and can't talk. Since Friday she has been dizzy described as "things spinning". Dizziness is worse with head movement, leaning down. Associated with nausea.  She was at work today and had another possible seizure. She had associated prodromal light-headedness, hot flashes, nausea and dizziness, word finding difficulty. 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  She remembers her manager coming to help, lowering her to the ground. She was awake during this episode as well but again couldn't talk during it. Her manager told her she jerked her whole body a couple of times but she doesn't remember this. States the last time she had a seizure was September 2019 when she was admitted for possible CVA vs TIA. Her work up was normal and she was discharge with diagnosis of seizures or complex migraines. She was eventually weaned off keppra, last dose in September. Currently reporting dizziness "spinning" and left frontal headache, sharp, gradual in onset after seizure today, 8/10. No  interventions. No alleviating factors. She has been complaint with all her medicines. She has word finding difficulty when she gets bad headaches.  Denies h/o vertigo. No recent sinus or ENT infections recently. No recent head trauma, falls. No associated vision loss, diplopia, difficulty with swallowing , one sided weakness and numbness, difficulty walking or balance. Has some gait instability at baseline that she attributes to cerebral palsy. No fever, neck pain or stiffness. No illicit drug or ETOH use. Has 5-6 headaches a month.     HPI  Past Medical History:  Diagnosis Date   Cerebral palsy (HCC)    Common migraine 08/23/2016   Disturbance of skin sensation 12/30/2013   Headache(784.0) 12/30/2013   Migraine    Pain in limb 12/30/2013   Seizures (HCC)    Syncope     Patient Active Problem List   Diagnosis Date Noted   Muscle cramps 10/22/2018   TIA (transient ischemic attack) 05/31/2018   Hypokalemia 05/31/2018   CP (cerebral palsy) (HCC) mild 05/31/2018   Dysphasia 05/31/2018   Sensory disturbance 05/31/2018   Complicated migraine 05/31/2018   Common migraine 08/23/2016   Right sided abdominal pain 09/08/2014   Pain in limb 12/30/2013   Disturbance of skin sensation 12/30/2013   Syncope and collapse 12/30/2013   Headache 12/30/2013    Past Surgical History:  Procedure Laterality Date   EYE SURGERY     TOOTH EXTRACTION       OB History   No obstetric history on file.  Home Medications    Prior to Admission medications   Medication Sig Start Date End Date Taking? Authorizing Provider  medroxyPROGESTERone (DEPO-PROVERA) 150 MG/ML injection Inject 150 mg into the muscle every 3 (three) months.    Yes [provider]  rizatriptan (MAXALT-MLT) 10 MG disintegrating tablet TAKE 1 TABLET BY MOUTH THREE TIMES DAILY AS NEEDED FOR MIGRAINE Patient taking differently: Take 10 mg by mouth as needed for migraine. TAKE 1 TABLET BY MOUTH  THREE TIMES DAILY AS NEEDED FOR MIGRAINE 10/22/18  Yes Suzzanne Cloud, NP  Topiramate ER (TROKENDI XR) 200 MG CP24 Take 1 capsule by mouth daily. 10/22/18  Yes Suzzanne Cloud, NP  meclizine (ANTIVERT) 25 MG tablet Take 1 tablet (25 mg total) by mouth 3 (three) times daily as needed for dizziness. 02/28/19   Kinnie Feil, PA-C    Family History Family History  Problem Relation Age of Onset   Hypertension Father    Diabetes Maternal Grandmother    Diabetes Paternal Grandmother    Seizures Paternal Grandmother    Diabetes Mother    Seizures Paternal Grandfather     Social History Social History   Tobacco Use   Smoking status: Never Smoker   Smokeless tobacco: Never Used  Substance Use Topics   Alcohol use: No    Alcohol/week: 0.0 standard drinks   Drug use: No     Allergies   Keppra [levetiracetam]   Review of Systems Review of Systems  Gastrointestinal: Positive for nausea.  Neurological: Positive for dizziness, speech difficulty and headaches.       Possible seizure   All other systems reviewed and are negative.    Physical Exam Updated Vital Signs BP 100/73    Pulse 100    Resp 16    LMP 02/28/2019    SpO2 99%   Physical Exam Vitals signs and nursing note reviewed.  Constitutional:      Appearance: She is well-developed.     Comments: NAD.  HENT:     Head: Normocephalic and atraumatic.     Right Ear: External ear normal.     Left Ear: External ear normal.     Nose: Nose normal.  Eyes:     Conjunctiva/sclera: Conjunctivae normal.     Comments: R strabismus (at baseline per patient)   Neck:     Musculoskeletal: Normal range of motion and neck supple.  Cardiovascular:     Rate and Rhythm: Normal rate and regular rhythm.     Heart sounds: Normal heart sounds.  Pulmonary:     Effort: Pulmonary effort is normal.     Breath sounds: Normal breath sounds.  Musculoskeletal: Normal range of motion.  Skin:    General: Skin is warm and dry.      Capillary Refill: Capillary refill takes less than 2 seconds.  Neurological:     Mental Status: She is alert and oriented to person, place, and time.     Gait: Gait abnormal.     Comments:  Alert and oriented to self, place, time and event.  Speech is fluent without dysarthria or dysphasia. Strength 5/5 with hand grip and ankle F/E.   Sensation to light touch intact in face, hands and feet. Subtle unsteady gait (at baseline per patient from cerebral palsy), unable to to tandem walk (at baseline per patient 2/2 cerebral palsy). Sits up with no truncal sway. Normal Romberg. No pronator drift. No leg drop. Normal finger-to-nose and heel to shin.  CN I not tested CN  II grossly intact visual fields bilaterally. Unable to visualize posterior eye. CN III, IV, VI PEERL. R strabismus (chronic per patient), otherwise EOMs intact bilaterally CN V light touch intact in all 3 divisions of trigeminal nerve CN VII facial movements symmetric CN VIII not tested CN IX, X no uvula deviation, symmetric rise of soft palate  CN XI 5/5 SCM and trapezius strength bilaterally  CN XII Midline tongue protrusion, symmetric L/R movements  Psychiatric:        Behavior: Behavior normal.        Thought Content: Thought content normal.        Judgment: Judgment normal.      ED Treatments / Results  Labs (all labs ordered are listed, but only abnormal results are displayed) Labs Reviewed  CBC WITH DIFFERENTIAL/PLATELET - Abnormal; Notable for the following components:      Result Value   WBC 11.9 (*)    All other components within normal limits  COMPREHENSIVE METABOLIC PANEL - Abnormal; Notable for the following components:   Potassium 3.1 (*)    Chloride 112 (*)    CO2 16 (*)    Calcium 8.8 (*)    AST 13 (*)    All other components within normal limits  MAGNESIUM  PHOSPHORUS  ETHANOL  CBG MONITORING, ED  POC URINE PREG, ED    EKG None  Radiology No results found.  Procedures Procedures  (including critical care time)  Medications Ordered in ED Medications  diazepam (VALIUM) injection 2.5 mg (2.5 mg Intravenous Given 02/28/19 2005)  metoCLOPramide (REGLAN) injection 10 mg (10 mg Intravenous Given 02/28/19 2003)  ketorolac (TORADOL) 30 MG/ML injection 30 mg (30 mg Intravenous Given 02/28/19 2003)  dexamethasone (DECADRON) injection 10 mg (10 mg Intravenous Given 02/28/19 2003)  sodium chloride 0.9 % bolus 500 mL (0 mLs Intravenous Stopped 02/28/19 2100)     Initial Impression / Assessment and Plan / ED Course  I have reviewed the triage vital signs and the nursing notes.  Pertinent labs & imaging results that were available during my care of the patient were reviewed by me and considered in my medical decision making (see chart for details).  Clinical Course as of Feb 28 2215  Thu Feb 28, 2019  2128 I have re-evaluated pt, she has had complete resolution of dizziness HA after valium and migraine cocktail.    [CG]    Clinical Course User Index [CG] Liberty HandyGibbons, Aylyn Wenzler J, PA-C   I have reviewed patient's EMR including recent neurology documentation and work-up, hospital admission September 2019.  At that time, she presented with numbness in the right side and word finding difficulty.  Hospital work-up including MRI brain, CTA head/neck, EEG were normal.  It was thought that symptoms were from complex migraine or seizure event.  She was started on Keppra which was then discontinued by neurology in September.  Her history is not very typical of seizures, stroke, TIA.  She is awake during these episodes and remembers the events.  She has no tongue biting, incontinence.  She has no new or persistent neurological symptoms or deficits on exam.  ER work-up reviewed and interpreted by me, reassuring.  No electrolyte abnormalities.  No leukocytosis.  No recent illnesses, fevers that would lower her seizure threshold.  No preceding head trauma.  No anticoagulation.  She has no fever,  meningismus, generalized rash to suggest a central nervous system infectious process.  No documentation of illicit drug/EtOH use and withdrawal seizure is unlikely.  Given her  reassuring exam, your work-up and recent extensive neurological work-up September 2019 I do not think further emergent work-up or admission is indicated today.  I reevaluated patient.  She had complete resolution of her dizziness which sounds vertiginous with Valium.  She had complete resolution of her migraine with migraine cocktail here in the ER.    Suspect her symptoms are multifactorial probably from complex migraine, peripheral vertigo.  We will discharge her with meclizine and close follow-up with neurology.  Return precautions were discussed.  Patient is comfortable with this.  Discussed with EDP.  Final Clinical Impressions(s) / ED Diagnoses   Final diagnoses:  Seizure-like activity (HCC)  Left-sided headache  Dizziness    ED Discharge Orders         Ordered    meclizine (ANTIVERT) 25 MG tablet  3 times daily PRN     02/28/19 2207           Liberty HandyGibbons, Pepper Kerrick J, PA-C 02/28/19 2216    Donnetta Hutchingook, Brian, MD 03/04/19 1547

## 2019-02-28 NOTE — ED Notes (Addendum)
Pt reports she is on her period but is refusing in and out cath at this time.

## 2019-02-28 NOTE — ED Notes (Signed)
Pt urinated in cup, pt on cycle, urine contaminated, able to obtain POC preg

## 2019-02-28 NOTE — ED Triage Notes (Signed)
Ems says patient had witnessed seizure at work today.  Reports history of seizures.  EMS says pt had a seizure last week also.  EMS says pt was postictal upon their arrival.  Pt alert but not answering questions appropriately yet.

## 2019-02-28 NOTE — ED Notes (Addendum)
Pt states she has been feeling dizzy when bending over or turning head swiftly since last Friday after a seizure.   Pt had witnessed seizure today while at work. Pt states her coworker helped her to the floor and pt was told afterwards she was exhibiting jerky movements. Patient states she did experience aura before seizure including diaphoresis, and nausea. Denies injury from seizure activity.   Pt states she is on seizure medication daily at bedtime but she cannot remember the name of the medication. States she has an adverse reaction to Keppra- it induces more seizures.   Patient has returned to baseline neurologically at this time. Complains of nausea and sharp frontal headache.

## 2019-04-21 NOTE — Progress Notes (Signed)
PATIENT: Sara Hubbard DOB: [birth year]  REASON FOR VISIT: follow up HISTORY FROM: patient  HISTORY OF PRESENT ILLNESS: Today 04/22/19  Ms. Louisa SecondFain is a 23 year old female with history of migraine headache and possible seizures.  She remains on Trokendi XR 200 mg daily and Maxalt as needed. She was in the ER 02/28/2019 for possible seizure.  During that ER visit she reported a few days prior she had been sitting on the toilet, felt prodromal dizziness, nausea, hot flashes and word finding difficulty.  She says she stared off into space and could not talk. Her finance said she was very pale at the time, and she was sweaty. She felt she has going to have diarrhea but didn't.  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 She says her manager lowered her to the ground, reported that her whole body jerked a few times but she does not remember. There was no urinary incontinence or oral injury. She does say that this was witnessed by a nurse who was a bystander.  She did not have a headache at the time.  She describes her typical seizure events as staring off and word finding difficulty.  Initially she was started on Keppra, but but this was discontinued in September 2019.  With her most recent ER visit she was discharged home and the event was likely attributed to a complex migraine or peripheral vertigo. There have no been any further events. She has been taking meclizine. It has been helping. She takes it when she feels dizzy. In the past, her events have presented as pale faced, feeling like she would pass out. She has been having more migraines lately. She is compliant with medication. She has been having 5-8 migraines a month. She thinks this is due to stress. She will take Maxalt for migraine and it helps. She did not like Keppra  because side effect of hot flashes.  She presents for follow-up unaccompanied.  HISTORY 10/22/2018 SS: Ms. Louisa SecondFain is a 23 year old female who presents for follow-up for migraine and possible seizures.  She is currently taking Trokendi XR 200 mg daily.  She is tolerating the medication well.  She uses Maxalt as needed. In September 2019, she was weaned off Keppra, after she was put on it for what was thought to be seizures, but was more consistent with a migraine headache.   She reports that she is having about 3 migraines a month.  She reports that she is back in school and that her headache frequency is directly related to her stress level.  She reports that some months she will have no headaches.  Of the headache, she describes it as starting in between her eyes and then spreading to her entire head.  She reports that she will take 1 tablet of Maxalt early on and get relief within about 1 hour.  She denies any seizure events.  She reports that her last seizure was 1.5 years ago.  She describes her seizure episodes as events where she will stare off and that she is aware of the events before they happen.  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  She reports that she has been having muscle spasms diffusely throughout her body that are worse at night.  She reports that she will feel  knots in her muscles and that the spasms commonly occur in her left shoulder blade and in her calves.  She does have mild cerebral palsy and her primary doctor mentioned this could be a component.  She reports that over the last few months her spasms have become more frequent and are most bothersome at nighttime.  She presents today for follow-up unaccompanied  REVIEW OF SYSTEMS: Out of a complete 14 system review of symptoms, the patient complains only of the following symptoms, and all other reviewed systems are negative.  Seizure-like events, headaches  ALLERGIES: Allergies  Allergen Reactions    Keppra [Levetiracetam] Other (See Comments)    Induces increased seizure activity in pt.     HOME MEDICATIONS: Outpatient Medications Prior to Visit  Medication Sig Dispense Refill   meclizine (ANTIVERT) 25 MG tablet Take 1 tablet (25 mg total) by mouth 3 (three) times daily as needed for dizziness. 30 tablet 0   medroxyPROGESTERone (DEPO-PROVERA) 150 MG/ML injection Inject 150 mg into the muscle every 3 (three) months.      rizatriptan (MAXALT-MLT) 10 MG disintegrating tablet TAKE 1 TABLET BY MOUTH THREE TIMES DAILY AS NEEDED FOR MIGRAINE (Patient taking differently: Take 10 mg by mouth as needed for migraine. TAKE 1 TABLET BY MOUTH THREE TIMES DAILY AS NEEDED FOR MIGRAINE) 9 tablet 3   Topiramate ER (TROKENDI XR) 200 MG CP24 Take 1 capsule by mouth daily. 90 capsule 3   No facility-administered medications prior to visit.     PAST MEDICAL HISTORY: Past Medical History:  Diagnosis Date   Cerebral palsy ([place])    Common migraine 08/23/2016   Disturbance of skin sensation 12/30/2013   Headache(784.0) 12/30/2013   Migraine    Pain in limb 12/30/2013   Seizures ([place])    Syncope     PAST SURGICAL HISTORY: Past Surgical History:  Procedure Laterality Date   EYE SURGERY     TOOTH EXTRACTION      FAMILY HISTORY: Family History  Problem Relation Age of Onset   Hypertension Father    Diabetes Maternal Grandmother    Diabetes Paternal Grandmother    Seizures Paternal Grandmother    Diabetes Mother    Seizures Paternal Grandfather     SOCIAL HISTORY: Social History   Socioeconomic History   Marital status: Significant Other    Spouse name: Not on file   Number of children: 0   Years of education: 12th   Highest education level: Not on file  Occupational History   Occupation: Management consultant strain: Not on file   Food insecurity    Worry: Not on file    Inability: Not on file   Transportation needs    Medical: Not  on file    Non-medical: Not on file  Tobacco Use   Smoking status: Never Smoker   Smokeless tobacco: Never Used  Substance and Sexual Activity   Alcohol use: No    Alcohol/week: 0.0 standard drinks   Drug use: No   Sexual activity: Not Currently    Birth control/protection: Injection  Lifestyle   Physical activity    Days per week: Not on file    Minutes per session: Not on file   Stress: Not on file  Relationships   Social connections    Talks on phone: Not on file    Gets together: Not on file    Attends religious service: Not on file    Active member of club or organization:  Not on file    Attends meetings of clubs or organizations: Not on file    Relationship status: Not on file   Intimate partner violence    Fear of current or ex partner: No    Emotionally abused: No    Physically abused: No    Forced sexual activity: No  Other Topics Concern   Not on file  Social History Narrative   Patient is single with no children.   Patient is right handed.   Patient has college education.   Patient drinks 1 cup daily.    PHYSICAL EXAM  Vitals:   04/22/19 1020  BP: 112/76  Pulse: (!) 51  Temp: 97.7 F (36.5 C)  TempSrc: Oral  Weight: 205 lb 12.8 oz (93.4 kg)  Height: 5\' 4"  (1.626 m)   Body mass index is 35.33 kg/m.  Generalized: Well developed, in no acute distress   Neurological examination  Mentation: Alert oriented to time, place, history taking. Follows all commands speech and language fluent Cranial nerve II-XII: Pupils were equal round reactive to light. Extraocular movements were full, visual field were full on confrontational test. Facial sensation and strength were normal.  Head turning and shoulder shrug  were normal and symmetric. Motor: The motor testing reveals 5 over 5 strength of all 4 extremities. Good symmetric motor tone is noted throughout.  Sensory: Sensory testing is intact to soft touch on all 4 extremities. No evidence of extinction  is noted.  Coordination: Cerebellar testing reveals good finger-nose-finger and heel-to-shin bilaterally.  Gait and station: Gait is normal. Tandem gait is normal. Romberg is negative. No drift is seen.  Reflexes: Deep tendon reflexes are symmetric and normal bilaterally.   DIAGNOSTIC DATA (LABS, IMAGING, TESTING) - I reviewed patient records, labs, notes, testing and imaging myself where available.  Lab Results  Component Value Date   WBC 11.9 (H) 02/28/2019   HGB 13.2 02/28/2019   HCT 40.3 02/28/2019   MCV 91.2 02/28/2019   PLT 185 02/28/2019      Component Value Date/Time   NA 139 02/28/2019 2005   K 3.1 (L) 02/28/2019 2005   CL 112 (H) 02/28/2019 2005   CO2 16 (L) 02/28/2019 2005   GLUCOSE 88 02/28/2019 2005   BUN 16 02/28/2019 2005   CREATININE 0.74 02/28/2019 2005   CALCIUM 8.8 (L) 02/28/2019 2005   PROT 6.8 02/28/2019 2005   ALBUMIN 4.0 02/28/2019 2005   AST 13 (L) 02/28/2019 2005   ALT 10 02/28/2019 2005   ALKPHOS 56 02/28/2019 2005   BILITOT 0.5 02/28/2019 2005   GFRNONAA >60 02/28/2019 2005   GFRAA >60 02/28/2019 2005   Lab Results  Component Value Date   CHOL 141 05/31/2018   HDL 50 05/31/2018   LDLCALC 82 05/31/2018   TRIG 44 05/31/2018   CHOLHDL 2.8 05/31/2018   Lab Results  Component Value Date   HGBA1C 5.2 05/30/2018   No results found for: VITAMINB12 No results found for: TSH  ASSESSMENT AND PLAN 23 y.o. year old female  has a past medical history of Cerebral palsy (HCC), Common migraine (08/23/2016), Disturbance of skin sensation (12/30/2013), Headache(784.0) (12/30/2013), Migraine, Pain in limb (12/30/2013), Seizures (HCC), and Syncope. here with:  1.  Headaches 2.  Seizure-like events  She was last in the ER 02/28/2019 for possible seizure, was discharged home with diagnosis of possible vertigo with complicated migraine.  She describes an event of becoming pale, feeling dizzy, like she was going to pass out while sitting on  the toilet.  This  occurred on Friday, for the rest of the weekend she felt dizzy.  She went to the ER at Monday, for a second event, with the same features, passing out for a few moments, bystander witnessed a few body shakes.  She did not have a headache at the time of the event.  She has been compliant on Trokendi.  In the past, she has had similar episodes, she was taken off Keppra last year.  She was admitted to the hospital in September 2019 for near syncope, diaphoresis, and right-sided numbness.  She has had normal EEGs, (last was 05/31/2018).  She had a CTA of the head and neck 05/30/2018 that were normal.  MRI of the brain 05/31/2018 was normal.  I am not sure that the most recent event in June 2020 represents a seizure.  However, she did not have a headache at the time she reports.  I have suggested she monitor her symptoms, let us know if the spells continue to occur.  I do think she should see a primary care doctor, may need further work-up to evaluate the near syncopal spells (possible prolonged cardiac monitoring?).  In regards to her headaches, she reports her headaches have been slightly more frequent lately due to stress of COVID (5-8 a month).  She does not wish to adjust her headache regimen at this time.  She will continue taking Trokendi XR 200 mg daily, Maxalt as needed.  If her headaches become more frequent she will let us know.  Of note, she is taking meclizine and that has been helpful for a few episodes of dizziness.  She will follow-up in 6 months or sooner if needed.  I advised that if her symptoms worsen or she develops any new symptoms she should let us know.  I spent 25 minutes with the patient. 50% of this time was spent discussing her plan of care.   Margie EgeSarah Aralyn Nowak, AGNP-C, DNP 04/22/2019, 10:45 AM [facility] [address], [address] [place], [place] [address] [phone]

## 2019-04-22 ENCOUNTER — Encounter: Payer: Self-pay | Admitting: Neurology

## 2019-04-22 ENCOUNTER — Ambulatory Visit: Payer: Medicaid Other | Admitting: Neurology

## 2019-04-22 ENCOUNTER — Other Ambulatory Visit: Payer: Self-pay

## 2019-04-22 VITALS — BP 112/76 | HR 51 | Temp 97.7°F | Ht 64.0 in | Wt 205.8 lb

## 2019-04-22 DIAGNOSIS — G43009 Migraine without aura, not intractable, without status migrainosus: Secondary | ICD-10-CM | POA: Diagnosis not present

## 2019-04-22 DIAGNOSIS — R55 Syncope and collapse: Secondary | ICD-10-CM | POA: Diagnosis not present

## 2019-04-22 MED ORDER — RIZATRIPTAN BENZOATE 10 MG PO TBDP: ORAL_TABLET | ORAL | 3 refills | Status: DC

## 2019-04-22 MED ORDER — TROKENDI XR 200 MG PO CP24: 1.0000 | ORAL_CAPSULE | Freq: Every day | ORAL | 3 refills | Status: DC

## 2019-04-25 NOTE — Progress Notes (Signed)
I have read the note, and I agree with the clinical assessment and plan.  Marylene Masek K Betina Puckett   

## 2019-05-02 ENCOUNTER — Other Ambulatory Visit: Payer: Self-pay

## 2019-05-02 ENCOUNTER — Encounter: Payer: Self-pay | Admitting: Cardiovascular Disease

## 2019-05-02 ENCOUNTER — Ambulatory Visit (INDEPENDENT_AMBULATORY_CARE_PROVIDER_SITE_OTHER): Payer: Medicaid Other | Admitting: Cardiovascular Disease

## 2019-05-02 VITALS — BP 118/80 | HR 55 | Ht 64.0 in | Wt 199.0 lb

## 2019-05-02 DIAGNOSIS — R55 Syncope and collapse: Secondary | ICD-10-CM | POA: Diagnosis not present

## 2019-05-02 NOTE — Patient Instructions (Signed)
Medication Instructions:  Continue all current medications.  Labwork: none  Testing/Procedures:  Your physician has recommended that you wear a 30 day event monitor. Event monitors are medical devices that record the heart's electrical activity. Doctors most often [place] these monitors to diagnose arrhythmias. Arrhythmias are problems with the speed or rhythm of the heartbeat. The monitor is a small, portable device. You can wear one while you do your normal daily activities. This is usually used to diagnose what is causing palpitations/syncope (passing out).  Office will contact with results via phone or letter.    Follow-Up: Pending test results.   Any Other Special Instructions Will Be Listed Below (If Applicable).  If you need a refill on your cardiac medications before your next appointment, please call your pharmacy.

## 2019-05-02 NOTE — Progress Notes (Signed)
CARDIOLOGY CONSULT NOTE  Patient ID: Sara Hubbard MRN: [id number] DOB/AGE: [birth year] 23 y.o.  Admit date: (Not on file) Primary Physician: Richardean Chimeraaniel, Terry G, MD Referring Physician: Dr. Fara ChutePaul Sasser  Reason for Consultation: Near syncope  HPI: Sara PeriMarisa Mashek is a 23 y.o. female who is being seen today for the evaluation of near syncope at the request of Sasser, Clarene CritchleyPaul W, MD.   She was evaluated by neurology on 04/22/2019.  I reviewed their records.  It appears she has a history of migraine headaches and possible seizures.  She was evaluated in the ED on 02/28/2019 for possible seizure.  Few days prior to that visit she had been sitting on the toilet and felt dizzy, nauseous, had hot flashes, and word finding difficulty.  She stared off into space and was unable to speak.  She was apparently pale and had some diaphoresis.  She had another event a few days later with similar complaints.  I reviewed MRI of the brain performed on 05/31/2018 which showed no acute findings.  She also underwent a normal CTA of the head and neck on 05/31/2018.  She denies chest pain, leg swelling, orthopnea, and shortness of breath.  She seldom has palpitations.  She describes bad feet cramps which have been attributed to her cerebral palsy which appears to be very mild.  She used to frequently pass out as a child and then in the 6 grade and that was when she was diagnosed with seizures.   Allergies  Allergen Reactions  . Keppra [Levetiracetam] Other (See Comments)    Induces increased seizure activity in pt.     Current Outpatient Medications  Medication Sig Dispense Refill  . meclizine (ANTIVERT) 25 MG tablet Take 1 tablet (25 mg total) by mouth 3 (three) times daily as needed for dizziness. 30 tablet 0  . medroxyPROGESTERone (DEPO-PROVERA) 150 MG/ML injection Inject 150 mg into the muscle every 3 (three) months.     . rizatriptan (MAXALT-MLT) 10 MG disintegrating tablet TAKE 1 TABLET BY MOUTH THREE TIMES  DAILY AS NEEDED FOR MIGRAINE 9 tablet 3  . Topiramate ER (TROKENDI XR) 200 MG CP24 Take 1 capsule by mouth daily. 90 capsule 3   No current facility-administered medications for this visit.     Past Medical History:  Diagnosis Date  . Cerebral palsy (HCC)   . Common migraine 08/23/2016  . Disturbance of skin sensation 12/30/2013  . Headache(784.0) 12/30/2013  . Migraine   . Pain in limb 12/30/2013  . Seizures (HCC)   . Syncope     Past Surgical History:  Procedure Laterality Date  . EYE SURGERY    . TOOTH EXTRACTION      Social History   Socioeconomic History  . Marital status: Single    Spouse name: Not on file  . Number of children: 0  . Years of education: 12th  . Highest education level: Not on file  Occupational History  . Occupation: Consulting civil engineertudent  Social Needs  . Financial resource strain: Not on file  . Food insecurity    Worry: Not on file    Inability: Not on file  . Transportation needs    Medical: Not on file    Non-medical: Not on file  Tobacco Use  . Smoking status: Never Smoker  . Smokeless tobacco: Never Used  Substance and Sexual Activity  . Alcohol use: No    Alcohol/week: 0.0 standard drinks  . Drug use: No  . Sexual activity: Not Currently  Birth control/protection: Injection  Lifestyle  . Physical activity    Days per week: Not on file    Minutes per session: Not on file  . Stress: Not on file  Relationships  . Social Herbalist on phone: Not on file    Gets together: Not on file    Attends religious service: Not on file    Active member of club or organization: Not on file    Attends meetings of clubs or organizations: Not on file    Relationship status: Not on file  . Intimate partner violence    Fear of current or ex partner: No    Emotionally abused: No    Physically abused: No    Forced sexual activity: No  Other Topics Concern  . Not on file  Social History Narrative   Patient is single with no children.   Patient  is right handed.   Patient has college education.   Patient drinks 1 cup daily.     No family history of premature CAD in 1st degree relatives.  Current Meds  Medication Sig  . meclizine (ANTIVERT) 25 MG tablet Take 1 tablet (25 mg total) by mouth 3 (three) times daily as needed for dizziness.  . medroxyPROGESTERone (DEPO-PROVERA) 150 MG/ML injection Inject 150 mg into the muscle every 3 (three) months.   . rizatriptan (MAXALT-MLT) 10 MG disintegrating tablet TAKE 1 TABLET BY MOUTH THREE TIMES DAILY AS NEEDED FOR MIGRAINE  . Topiramate ER (TROKENDI XR) 200 MG CP24 Take 1 capsule by mouth daily.      Review of systems complete and found to be negative unless listed above in HPI    Physical exam Blood pressure 118/80, pulse (!) 55, height 5\' 4"  (1.626 m), weight 199 lb (90.3 kg). General: NAD Neck: No JVD, no thyromegaly or thyroid nodule.  Lungs: Clear to auscultation bilaterally with normal respiratory effort. CV: Nondisplaced PMI. Regular rate and rhythm, normal S1/S2, no S3/S4, no murmur.  No peripheral edema.  No carotid bruit.    Abdomen: Soft, nontender, no distention.  Skin: Intact without lesions or rashes.  Neurologic: Alert and oriented x 3.  Psych: Normal affect. Extremities: No clubbing or cyanosis.  HEENT: Normal.   ECG: Sinus rhythm with sinus arrhythmia   Labs: Lab Results  Component Value Date/Time   K 3.1 (L) 02/28/2019 08:05 PM   BUN 16 02/28/2019 08:05 PM   CREATININE 0.74 02/28/2019 08:05 PM   ALT 10 02/28/2019 08:05 PM   HGB 13.2 02/28/2019 08:05 PM     Lipids: Lab Results  Component Value Date/Time   LDLCALC 82 05/31/2018 05:07 AM   CHOL 141 05/31/2018 05:07 AM   TRIG 44 05/31/2018 05:07 AM   HDL 50 05/31/2018 05:07 AM        ASSESSMENT AND PLAN:  1.  Near syncope: Unclear etiology.  I doubt a cardiac etiology.  However, to be prudent I will proceed with 30-day event monitoring.  I will evaluate for symptomatic bradycardia in particular.      Disposition: Follow up to be determined  Signed: Kate Sable, M.D., F.A.C.C.  05/02/2019, 8:44 AM

## 2019-05-14 ENCOUNTER — Ambulatory Visit (INDEPENDENT_AMBULATORY_CARE_PROVIDER_SITE_OTHER): Payer: Medicaid Other

## 2019-05-14 DIAGNOSIS — R55 Syncope and collapse: Secondary | ICD-10-CM | POA: Diagnosis not present

## 2019-06-06 ENCOUNTER — Encounter

## 2019-06-06 ENCOUNTER — Ambulatory Visit: Payer: Medicaid Other | Admitting: Cardiovascular Disease

## 2019-06-24 ENCOUNTER — Telehealth: Payer: Self-pay

## 2019-06-24 NOTE — Telephone Encounter (Signed)
-----   Message from Herminio Commons, MD sent at 06/21/2019 11:08 AM EDT ----- Sinus rhythm and sinus tachycardia. Symptoms correlated with sinus tachycardia, 103 bpm. No worrisome arrhythmias.

## 2019-06-24 NOTE — Telephone Encounter (Signed)
[place] pt. 

## 2019-06-25 NOTE — Telephone Encounter (Signed)
No answer

## 2019-06-26 NOTE — Telephone Encounter (Signed)
Patient notified.  Copy to pmd.   

## 2019-08-12 ENCOUNTER — Telehealth: Payer: Self-pay | Admitting: Neurology

## 2019-08-12 NOTE — Telephone Encounter (Signed)
LVM to r/s 2/22 appt due to NP being out °

## 2019-10-19 IMAGING — MR MR HEAD W/O CM
6 of 10 series · 30 of 48 positions shown · non-contrast
Comparison: CTA head neck from earlier today.  Brain MRI 01/29/2014

CLINICAL DATA: Passed out on floor yesterday with weakness and
headache.

EXAM:
MRI HEAD WITHOUT CONTRAST
TECHNIQUE: Multiplanar, multiecho pulse sequences of the brain and surrounding
structures were obtained without intravenous contrast.

[Series 3: DWI · axial · 3.0mm · 0.74mm/px · z∈[-87,+75]mm · 7 of 55 slices shown (1 of 4)]
[im 1/55]
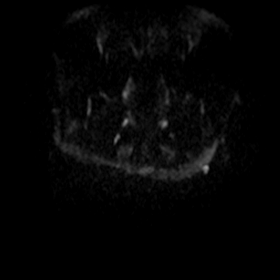
[im 10/55]
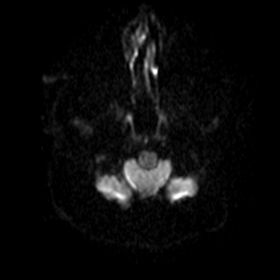
[im 19/55]
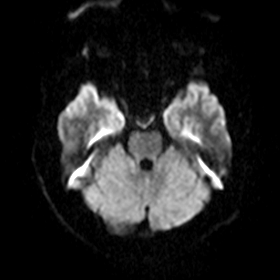
[im 28/55]
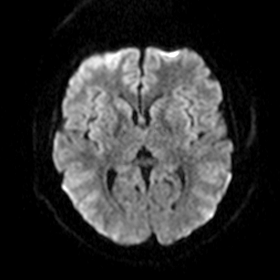
[im 37/55]
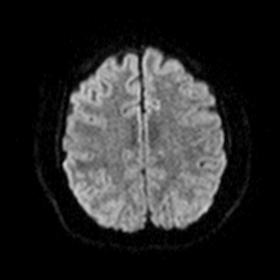
[im 46/55]
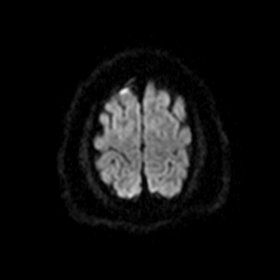
[im 55/55]
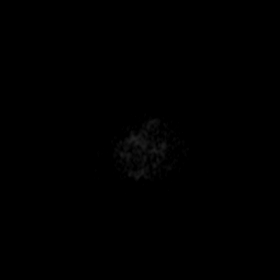

[Series 4: DWI · axial · 3.0mm · 0.74mm/px · z∈[-87,+75]mm · 6 of 55 slices shown (2 of 4)]
[im 1/55]
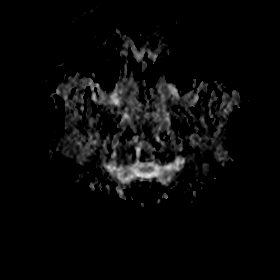
[im 11/55]
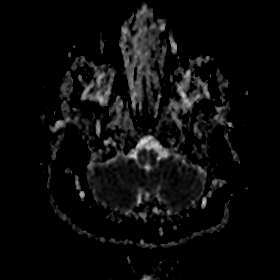
[im 22/55]
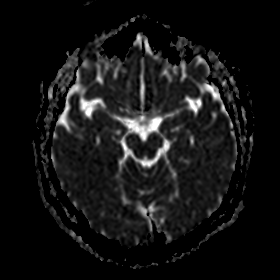
[im 33/55]
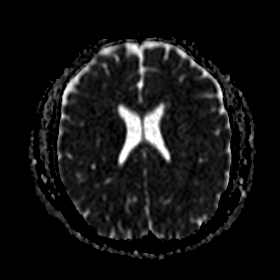
[im 44/55]
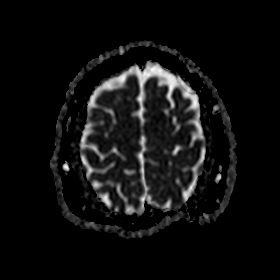
[im 55/55]
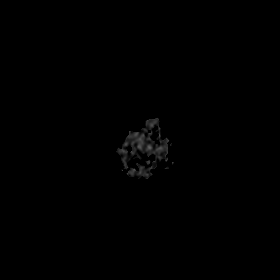

[Series 5: DWI · coronal · 5.0mm · 0.49mm/px · 4 of 38 slices shown (3 of 4)]
[im 1/38]
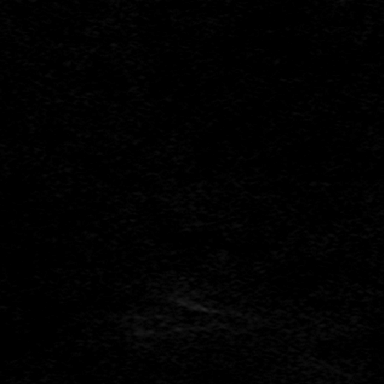
[im 13/38]
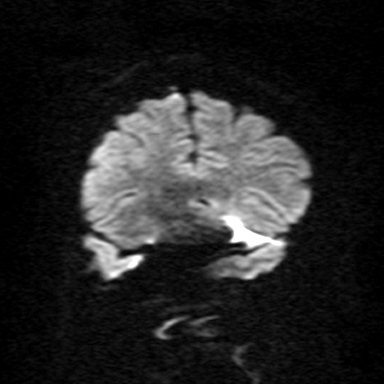
[im 25/38]
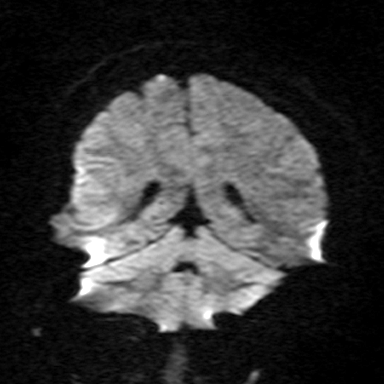
[im 38/38]
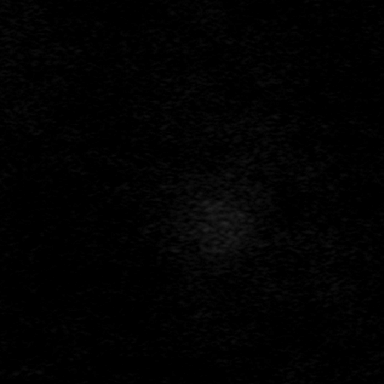

[Series 6: DWI · coronal · 5.0mm · 0.49mm/px · 4 of 37 slices shown (4 of 4)]
[im 1/37]
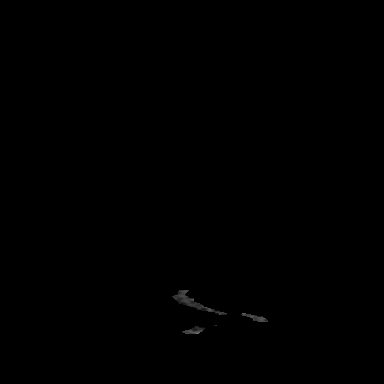
[im 13/37]
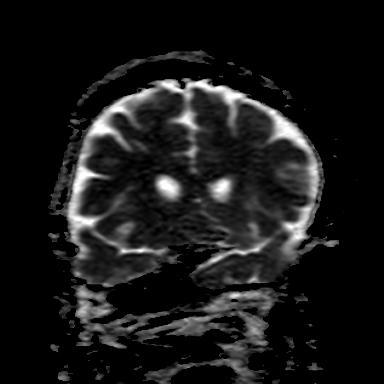
[im 25/37]
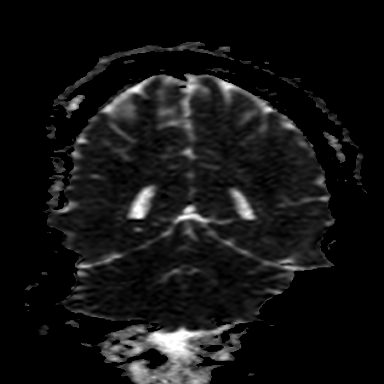
[im 37/37]
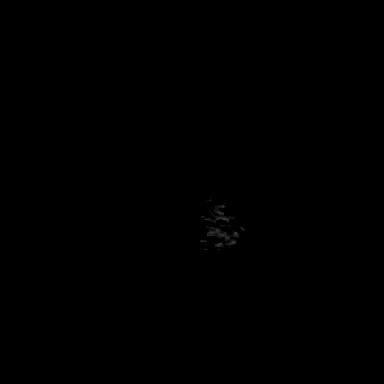

[Series 8: T2 · axial · 5.0mm · 0.44mm/px · z∈[-78,+65]mm · 3 of 23 slices shown]
[im 1/23]
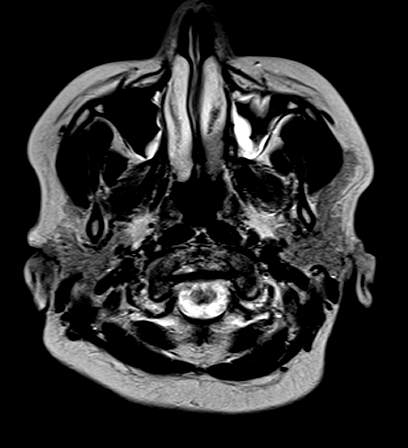
[im 12/23]
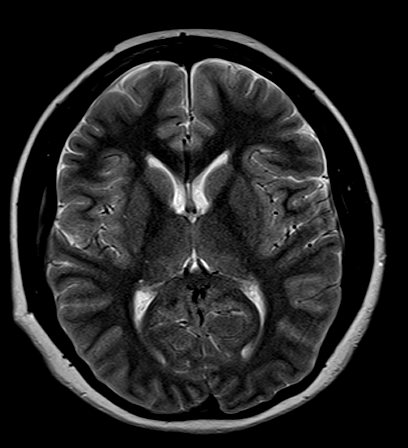
[im 23/23]
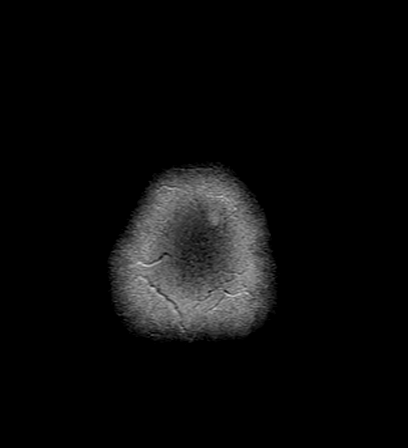

[Series 9: FLAIR · axial · 3.0mm · 0.30mm/px · z∈[-83,+70]mm · 6 of 52 slices shown]
[im 1/52]
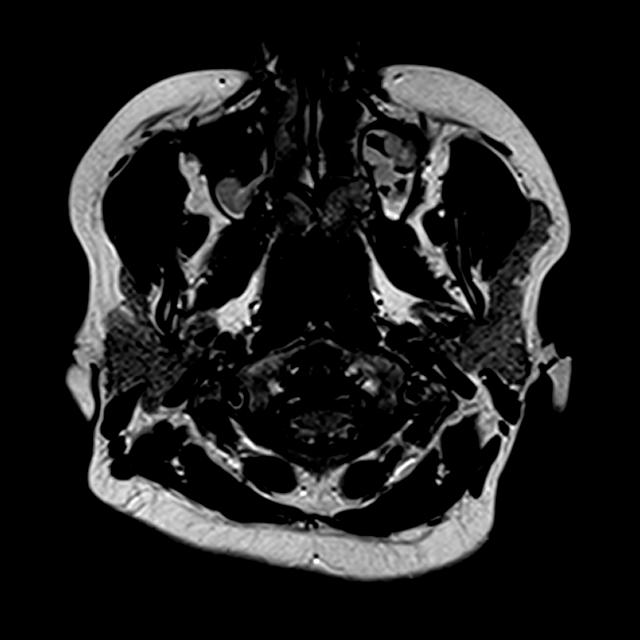
[im 11/52]
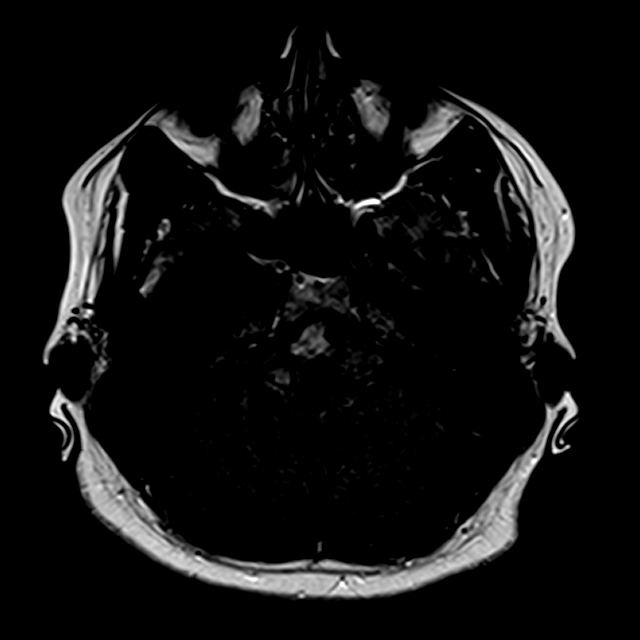
[im 21/52]
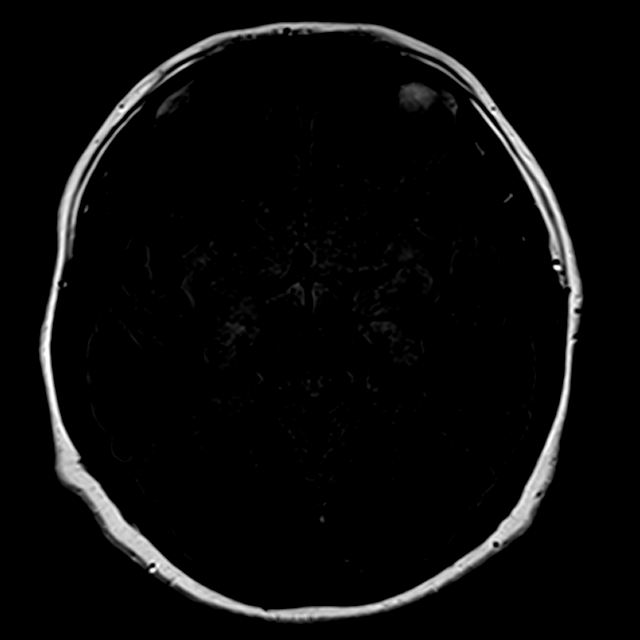
[im 31/52]
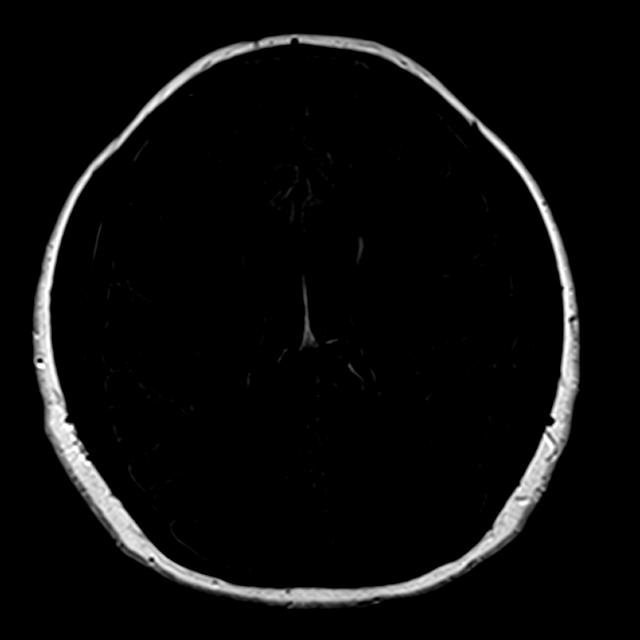
[im 41/52]
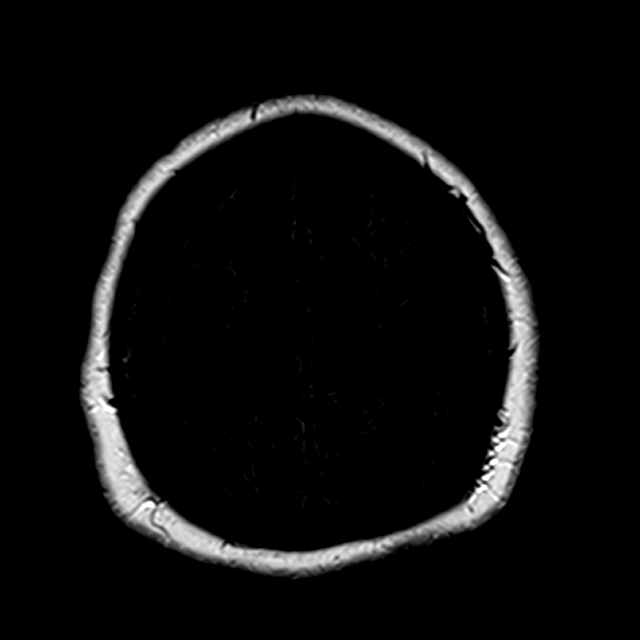
[im 52/52]
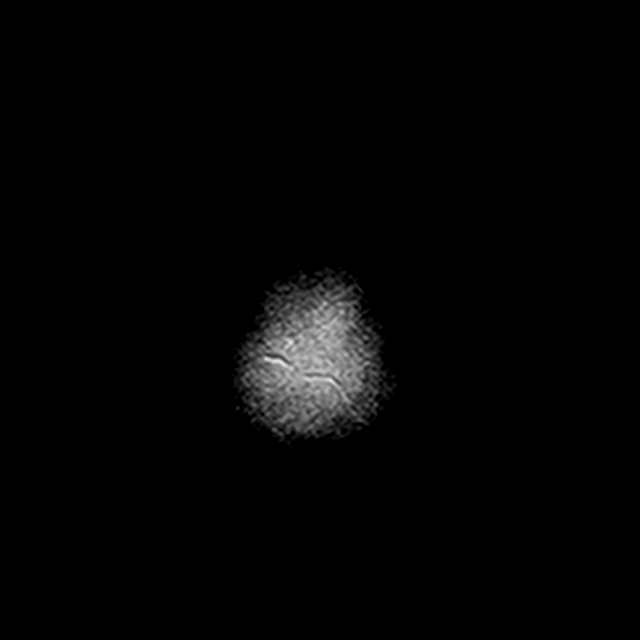

[30 of 48 positions shown; findings below may reference images not displayed]

FINDINGS: Brain: No acute infarction, hemorrhage, hydrocephalus, extra-axial
collection or mass lesion. Pituitary has an upward convex appearance
on sagittal T1 weighted imaging but is within normal limits at
craniocaudal measurement (8 mm) and is homogeneous in signal. This
appearance is likely related to shallow bony sella. Two small FLAIR
hyperintensities in the corpus callosum and right frontal white
matter from nonspecific remote insult, stable from 1092. Normal
brain volume.

Vascular: Major flow voids are preserved.

Skull and upper cervical spine: No evidence of marrow lesion.

Sinuses/Orbits: Mild mucosal thickening in the paranasal sinuses.
IMPRESSION: No acute finding or explanation for symptoms.

## 2019-10-19 IMAGING — CT CT ANGIO HEAD
1 of 8 series · 6 of 33 positions shown · IV contrast (Isovue)
Comparison: Prior CT from earlier the same day.

CLINICAL DATA: Initial evaluation for acute headache with transient
speech impairment.

EXAM:
CT ANGIOGRAPHY HEAD AND NECK
TECHNIQUE: Multidetector CT imaging of the head and neck was performed using
the standard protocol during bolus administration of intravenous
contrast. Multiplanar CT image reconstructions and MIPs were
obtained to evaluate the vascular anatomy. Carotid stenosis
measurements (when applicable) are obtained utilizing NASCET
criteria, using the distal internal carotid diameter as the
denominator.
CONTRAST:  100mL YLBVG4-AAQ IOPAMIDOL (YLBVG4-AAQ) INJECTION 76%

[Series 6: ax thin · axial · 0.39mm/px · z∈[-71,+167]mm · 6 of 334 slices shown]
[im 48/334  soft-tissue]
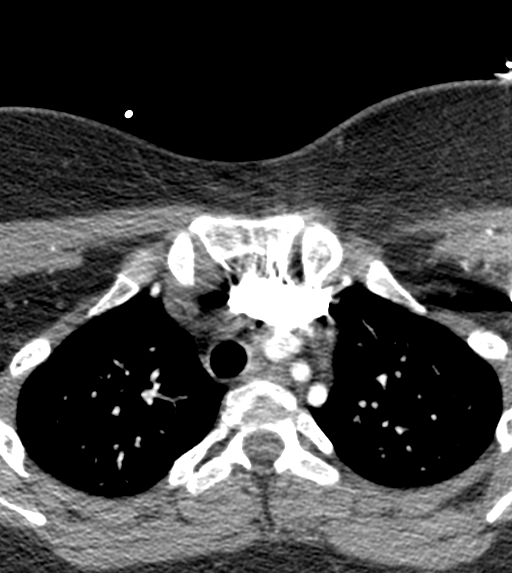
[im 96/334  bone]
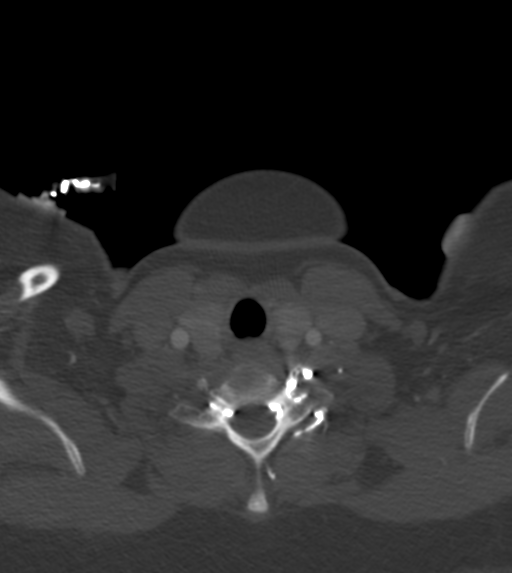
[im 143/334  soft-tissue]
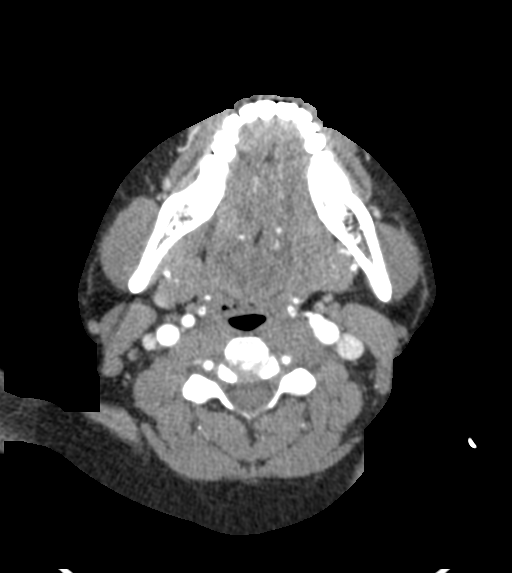
[im 191/334  bone]
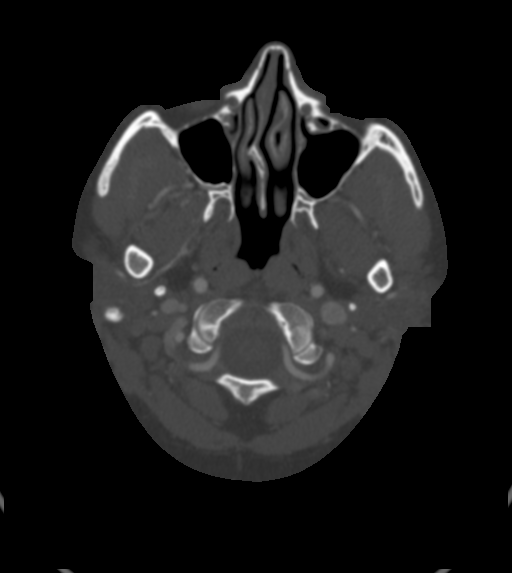
[im 238/334  soft-tissue]
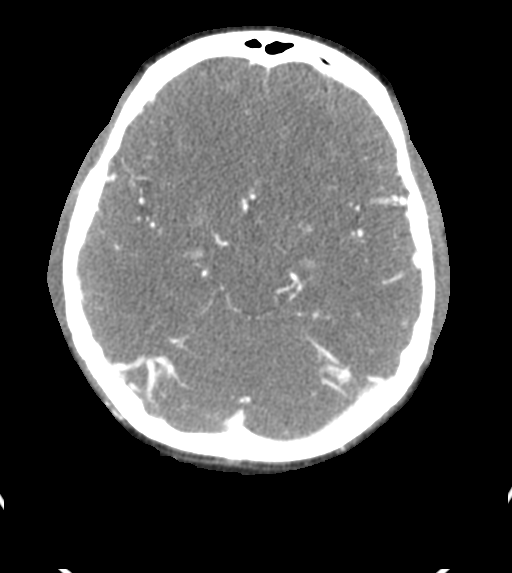
[im 286/334  bone]
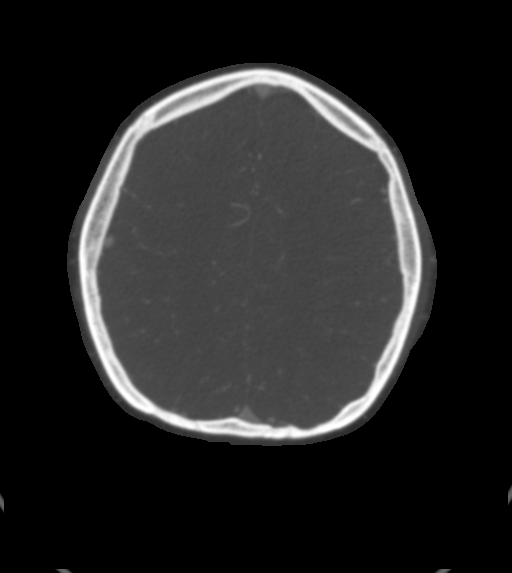

[6 of 33 positions shown; findings below may reference images not displayed]

FINDINGS: CTA NECK FINDINGS

Aortic arch: Visualized aortic arch of normal caliber with normal
branch pattern. No flow-limiting stenosis about the origin of the
great vessels. Visualized subclavian arteries widely patent.

Right carotid system: Right common and internal carotid arteries
widely patent without stenosis, dissection, or occlusion.

Left carotid system: Left common and internal carotid arteries
widely patent without stenosis, dissection, or occlusion.

Vertebral arteries: Both vertebral arteries arise from the
subclavian arteries. Pre foraminal left V1 segment not well assessed
due to adjacent venous contamination. Vertebral arteries otherwise
widely patent without stenosis, dissection, or occlusion.

Skeleton: No acute osseus abnormality. No discrete lytic or blastic
osseous lesions.

Other neck: No other acute soft tissue abnormality within the neck.

Upper chest: Visualized upper chest within normal limits.

Review of the MIP images confirms the above findings

CTA HEAD FINDINGS

Anterior circulation: Internal carotid arteries widely patent to the
termini without stenosis. A1 segments patent bilaterally. Left A1
hypoplastic. Normal anterior communicating artery. Anterior cerebral
arteries widely patent to their distal aspects. M1 segments widely
patent without stenosis. Normal MCA bifurcations. No proximal M2
occlusion. Distal MCA branches well perfused and symmetric.

Posterior circulation: Vertebral arteries widely patent to the
vertebrobasilar junction. Basilar artery widely patent to its distal
aspect. Superior cerebral arteries patent bilaterally. Fetal type
origin of the left PCA supplied via a prominent and widely patent
left posterior communicating artery. Hypoplastic right P1 with
prominent right posterior communicating artery as well. PCAs widely
patent to their distal aspects.

Venous sinuses: Patent.

Anatomic variants: Fetal type origin of the left PCA. Hypoplastic
left P1. No intracranial aneurysm or other vascular abnormality.

Delayed phase: No pathologic enhancement.

Review of the MIP images confirms the above findings
IMPRESSION: Normal CTA of the head and neck. No large vessel occlusion. No
hemodynamically significant or correctable stenosis.

## 2019-10-28 ENCOUNTER — Ambulatory Visit: Payer: Medicaid Other | Admitting: Neurology

## 2019-10-31 ENCOUNTER — Ambulatory Visit: Payer: Medicaid Other | Admitting: Neurology

## 2019-12-05 ENCOUNTER — Telehealth: Payer: Self-pay | Admitting: Neurology

## 2019-12-05 NOTE — Telephone Encounter (Signed)
[id number]. TROKENDI XR CAP 200MG  is approved through 09/04/2020. Your patient may now fill this prescription and it will be covered.

## 2019-12-05 NOTE — Telephone Encounter (Signed)
PA submitted through cover my med[facility] rx for Trokendi XR. [id number] Will wait for response.

## 2019-12-18 ENCOUNTER — Encounter: Payer: Self-pay | Admitting: Neurology

## 2019-12-18 ENCOUNTER — Ambulatory Visit (INDEPENDENT_AMBULATORY_CARE_PROVIDER_SITE_OTHER): Payer: Medicare Other | Admitting: Neurology

## 2019-12-18 ENCOUNTER — Other Ambulatory Visit: Payer: Self-pay

## 2019-12-18 VITALS — BP 104/74 | HR 84 | Temp 98.7°F | Ht 64.0 in | Wt 205.0 lb

## 2019-12-18 DIAGNOSIS — G43009 Migraine without aura, not intractable, without status migrainosus: Secondary | ICD-10-CM | POA: Diagnosis not present

## 2019-12-18 MED ORDER — TROKENDI XR 200 MG PO CP24: 1.0000 | ORAL_CAPSULE | Freq: Every day | ORAL | 3 refills | Status: AC

## 2019-12-18 MED ORDER — RIZATRIPTAN BENZOATE 10 MG PO TBDP: ORAL_TABLET | ORAL | 3 refills | Status: AC

## 2019-12-18 NOTE — Progress Notes (Signed)
PATIENT: Sara Hubbard DOB: [birth year]  REASON FOR VISIT: follow up HISTORY FROM: patient  HISTORY OF PRESENT ILLNESS: Today 12/18/19  Sara Hubbard 24 year old female with history of migraine headache and possible seizures vs. complicated migraine events.  She remains on Trokendi and Maxalt as needed.  She has seen cardiology, cardiac monitor did not show arrhythmia, some tachycardia, doesn't require follow-up.  She denies further seizure-like events.  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  Her father passed away in September 10, 2023.  On average having 1 migraine a week, Maxalt works well generally about 30 minutes.  She denies any new problems or concerns.  She is overall pleased with her medication regimen.  She presents today for follow-up unaccompanied.  HISTORY 04/22/2019 SS: Sara Hubbard is a 24 year old female with history of migraine headache and possible seizures.  She remains on Trokendi XR 200 mg daily and Maxalt as needed. She was in the ER 02/28/2019 for possible seizure.  During that ER visit she reported a few days prior she had been sitting on the toilet, felt prodromal dizziness, nausea, hot flashes and word finding difficulty.  She says she stared off into space and could not talk. Her finance said she was very pale at the time, and she was sweaty. She felt she has going to have diarrhea but didn't.  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 She says her manager lowered her to the ground, reported that her whole body jerked a few times but she does not remember. There was no urinary incontinence or oral injury. She does say that this was witnessed by a nurse who was a bystander.  She did not have a headache at the time.  She  describes her typical seizure events as staring off and word finding difficulty.  Initially she was started on Keppra, but but this was discontinued in September 2019.  With her most recent ER visit she was discharged home and the event was likely attributed to a complex migraine or peripheral vertigo. There have no been any further events. She has been taking meclizine. It has been helping. She takes it when she feels dizzy. In the past, her events have presented as pale faced, feeling like she would pass out. She has been having more migraines lately. She is compliant with medication. She has been having 5-8 migraines a month. She thinks this is due to stress. She will take Maxalt for migraine and it helps. She did not like Keppra because side effect of hot flashes.  She presents for follow-up unaccompanied.  REVIEW OF SYSTEMS: Out of a complete 14 system review of symptoms, the patient complains only of the following symptoms, and all other reviewed systems are negative.  Headache  ALLERGIES: Allergies  Allergen Reactions  . Keppra [Levetiracetam] Other (See Comments)    Induces increased seizure activity in pt.     HOME MEDICATIONS: Outpatient Medications Prior to Visit  Medication Sig Dispense Refill  . meclizine (ANTIVERT) 25 MG tablet Take 1 tablet (25 mg total) by mouth 3 (three) times daily as needed for dizziness. 30 tablet 0  . medroxyPROGESTERone (DEPO-PROVERA) 150 MG/ML injection Inject 150 mg into the muscle every 3 (three) months.     Marland Kitchen  rizatriptan (MAXALT-MLT) 10 MG disintegrating tablet TAKE 1 TABLET BY MOUTH THREE TIMES DAILY AS NEEDED FOR MIGRAINE 9 tablet 3  . Topiramate ER (TROKENDI XR) 200 MG CP24 Take 1 capsule by mouth daily. 90 capsule 3   No facility-administered medications prior to visit.    PAST MEDICAL HISTORY: Past Medical History:  Diagnosis Date  . Cerebral palsy (HCC)   . Common migraine 08/23/2016  . Disturbance of skin sensation 12/30/2013  .  Headache(784.0) 12/30/2013  . Migraine   . Pain in limb 12/30/2013  . Seizures (HCC)   . Syncope     PAST SURGICAL HISTORY: Past Surgical History:  Procedure Laterality Date  . EYE SURGERY    . TOOTH EXTRACTION      FAMILY HISTORY: Family History  Problem Relation Age of Onset  . Hypertension Father   . Diabetes Maternal Grandmother   . Diabetes Paternal Grandmother   . Seizures Paternal Grandmother   . Diabetes Mother   . Seizures Paternal Grandfather     SOCIAL HISTORY: Social History   Socioeconomic History  . Marital status: Single    Spouse name: Not on file  . Number of children: 0  . Years of education: 12th  . Highest education level: Not on file  Occupational History  . Occupation: Consulting civil engineer  Tobacco Use  . Smoking status: Never Smoker  . Smokeless tobacco: Never Used  Substance and Sexual Activity  . Alcohol use: No    Alcohol/week: 0.0 standard drinks  . Drug use: No  . Sexual activity: Not Currently    Birth control/protection: Injection  Other Topics Concern  . Not on file  Social History Narrative   Patient is single with no children.   Patient is right handed.   Patient has college education.   Patient drinks 1 cup daily.   Social Determinants of Health   Financial Resource Strain:   . Difficulty of Paying Living Expenses:   Food Insecurity:   . Worried About Programme researcher, broadcasting/film/video in the Last Year:   . Barista in the Last Year:   Transportation Needs:   . Freight forwarder (Medical):   Marland Kitchen Lack of Transportation (Non-Medical):   Physical Activity:   . Days of Exercise per Week:   . Minutes of Exercise per Session:   Stress:   . Feeling of Stress :   Social Connections:   . Frequency of Communication with Friends and Family:   . Frequency of Social Gatherings with Friends and Family:   . Attends Religious Services:   . Active Member of Clubs or Organizations:   . Attends Banker Meetings:   Marland Kitchen Marital Status:     Intimate Partner Violence:   . Fear of Current or Ex-Partner:   . Emotionally Abused:   Marland Kitchen Physically Abused:   . Sexually Abused:    PHYSICAL EXAM  Vitals:   12/18/19 1451  BP: 104/74  Pulse: 84  Temp: 98.7 F (37.1 C)  Weight: 205 lb (93 kg)  Height: 5\' 4"  (1.626 m)   Body mass index is 35.19 kg/m.  Generalized: Well developed, in no acute distress   Neurological examination  Mentation: Alert oriented to time, place, history taking. Follows all commands speech and language fluent Cranial nerve II-XII: Pupils were equal round reactive to light. Extraocular movements were full, visual field were full on confrontational test. Facial sensation and strength were normal.  Head turning and shoulder shrug  were normal and symmetric.  Motor: The motor testing reveals 5 over 5 strength of all 4 extremities. Good symmetric motor tone is noted throughout.  Sensory: Sensory testing is intact to soft touch on all 4 extremities. No evidence of extinction is noted.  Coordination: Cerebellar testing reveals good finger-nose-finger and heel-to-shin bilaterally.  Gait and station: Gait is normal. Tandem gait is normal. Romberg is negative. No drift is seen.  Reflexes: Deep tendon reflexes are symmetric and normal bilaterally.   DIAGNOSTIC DATA (LABS, IMAGING, TESTING) - I reviewed patient records, labs, notes, testing and imaging myself where available.  Lab Results  Component Value Date   WBC 11.9 (H) 02/28/2019   HGB 13.2 02/28/2019   HCT 40.3 02/28/2019   MCV 91.2 02/28/2019   PLT 185 02/28/2019      Component Value Date/Time   NA 139 02/28/2019 2005   K 3.1 (L) 02/28/2019 2005   CL 112 (H) 02/28/2019 2005   CO2 16 (L) 02/28/2019 2005   GLUCOSE 88 02/28/2019 2005   BUN 16 02/28/2019 2005   CREATININE 0.74 02/28/2019 2005   CALCIUM 8.8 (L) 02/28/2019 2005   PROT 6.8 02/28/2019 2005   ALBUMIN 4.0 02/28/2019 2005   AST 13 (L) 02/28/2019 2005   ALT 10 02/28/2019 2005   ALKPHOS  56 02/28/2019 2005   BILITOT 0.5 02/28/2019 2005   GFRNONAA >60 02/28/2019 2005   GFRAA >60 02/28/2019 2005   Lab Results  Component Value Date   CHOL 141 05/31/2018   HDL 50 05/31/2018   LDLCALC 82 05/31/2018   TRIG 44 05/31/2018   CHOLHDL 2.8 05/31/2018   Lab Results  Component Value Date   HGBA1C 5.2 05/30/2018   No results found for: VITAMINB12 No results found for: TSH  ASSESSMENT AND PLAN 24 y.o. year old female  has a past medical history of Cerebral palsy ([place]), Common migraine (08/23/2016), Disturbance of skin sensation (12/30/2013), Headache(784.0) (12/30/2013), Migraine, Pain in limb (12/30/2013), Seizures ([place]), and Syncope. here with:  1.  Migraine headaches 2.  Possible seizure-like events  She has not had further seizure-like events.  She got a good report of cardiology.  She will remain on Trokendi ER 200 mg at bedtime, Maxalt as needed.  If she has increasing headache, we may consider CGRP.  She will follow-up in 6 months or sooner if needed.  I spent 20 minutes of face-to-face and non-face-to-face time with patient.  This included previsit chart review, lab review, study review, order entry, electronic health record documentation, patient education.  Butler Denmark, AGNP-C, DNP 12/18/2019, 3:15 PM [facility] [address], [place], [place] [address] [phone]

## 2019-12-18 NOTE — Patient Instructions (Signed)
Continue current medications °See you back in 6 months  °

## 2019-12-18 NOTE — Progress Notes (Signed)
I have read the note, and I agree with the clinical assessment and plan.  Izyan Ezzell K Ashleynicole Mcclees   

## 2024-06-03 ENCOUNTER — Emergency Department (HOSPITAL_COMMUNITY)
Admission: EM | Admit: 2024-06-03 | Discharge: 2024-06-04 | Disposition: A | Discharge status: Home / Self Care | Attending: Emergency Medicine | Admitting: Emergency Medicine

## 2024-06-03 ENCOUNTER — Other Ambulatory Visit: Payer: Self-pay

## 2024-06-03 ENCOUNTER — Encounter (HOSPITAL_COMMUNITY): Payer: Self-pay

## 2024-06-03 DIAGNOSIS — R0602 Shortness of breath: Secondary | ICD-10-CM | POA: Diagnosis not present

## 2024-06-03 DIAGNOSIS — R42 Dizziness and giddiness: Secondary | ICD-10-CM | POA: Insufficient documentation

## 2024-06-03 DIAGNOSIS — R600 Localized edema: Secondary | ICD-10-CM | POA: Diagnosis not present

## 2024-06-03 DIAGNOSIS — M7989 Other specified soft tissue disorders: Secondary | ICD-10-CM | POA: Diagnosis present

## 2024-06-03 NOTE — ED Triage Notes (Signed)
 Pt to ED with c/o pain and swelling to LE in both legs sob, and dizziness, that started 3 days ago while in [place], pt got back home yesterday. Pt does not appear short of breath, breathing is not labored, pt talking normal and speaking full sentences. Pt with hx of cerebral palsy, pt reports she is ambulatory.

## 2024-06-04 ENCOUNTER — Emergency Department (HOSPITAL_COMMUNITY)

## 2024-06-04 DIAGNOSIS — R600 Localized edema: Secondary | ICD-10-CM | POA: Diagnosis not present

## 2024-06-04 LAB — CBC WITH DIFFERENTIAL/PLATELET
Abs Immature Granulocytes: 0.02 K/uL (ref 0.00–0.07)
Basophils Absolute: 0 K/uL (ref 0.0–0.1)
Basophils Relative: 0 %
Eosinophils Absolute: 0 K/uL (ref 0.0–0.5)
Eosinophils Relative: 0 %
HCT: 40.8 % (ref 36.0–46.0)
Hemoglobin: 13.5 g/dL (ref 12.0–15.0)
Immature Granulocytes: 0 %
Lymphocytes Relative: 29 %
Lymphs Abs: 2.8 K/uL (ref 0.7–4.0)
MCH: 30.7 pg (ref 26.0–34.0)
MCHC: 33.1 g/dL (ref 30.0–36.0)
MCV: 92.7 fL (ref 80.0–100.0)
Monocytes Absolute: 0.6 K/uL (ref 0.1–1.0)
Monocytes Relative: 6 %
Neutro Abs: 6.2 K/uL (ref 1.7–7.7)
Neutrophils Relative %: 65 %
Platelets: 283 K/uL (ref 150–400)
RBC: 4.4 MIL/uL (ref 3.87–5.11)
RDW: 12.9 % (ref 11.5–15.5)
WBC: 9.7 K/uL (ref 4.0–10.5)
nRBC: 0 % (ref 0.0–0.2)

## 2024-06-04 LAB — BASIC METABOLIC PANEL WITH GFR
Anion gap: 11 (ref 5–15)
BUN: 14 mg/dL (ref 6–20)
CO2: 24 mmol/L (ref 22–32)
Calcium: 9 mg/dL (ref 8.9–10.3)
Chloride: 102 mmol/L (ref 98–111)
Creatinine, Ser: 0.66 mg/dL (ref 0.44–1.00)
GFR, Estimated: >60 mL/min (ref >60)
Glucose, Bld: 82 mg/dL (ref 70–99)
Potassium: 3.4 mmol/L — ABNORMAL LOW (ref 3.5–5.1)
Sodium: 137 mmol/L (ref 135–145)

## 2024-06-04 LAB — D-DIMER, QUANTITATIVE: D-Dimer, Quant: <0.27 ug{FEU}/mL (ref 0.00–0.50)

## 2024-06-04 LAB — BRAIN NATRIURETIC PEPTIDE: B Natriuretic Peptide: 8 pg/mL (ref 0.0–100.0)

## 2024-06-04 MED ORDER — FUROSEMIDE 20 MG PO TABS: 20.0000 mg | ORAL_TABLET | Freq: Every day | ORAL | 0 refills | Status: AC

## 2024-06-04 MED ORDER — FUROSEMIDE 20 MG PO TABS: 20.0000 mg | ORAL_TABLET | Freq: Every day | ORAL | 0 refills | Status: DC

## 2024-06-04 NOTE — Discharge Instructions (Signed)
 You were seen today for lower extremity edema.  Your workup is reassuring including screening for blood clots.  Trial Lasix for the next 3 days.  Keep your extremities elevated.  Make sure to avoid excessive salt in your diet.

## 2024-06-04 NOTE — ED Provider Notes (Signed)
 [place] EMERGENCY DEPARTMENT AT [facility] Provider Note   CSN: [id number] Arrival date & time: 06/03/24  2032     Patient presents with: Leg Swelling (Dizziness, sob)   Sara Hubbard is a 28 y.o. female.   HPI     This is a 28 year old female with a history of several palsy who presents with lower extremity pain and swelling.  Patient reports swelling to bilateral lower extremities over the last 3 to 4 days.  She reports she noticed it when she was on her honeymoon but had been doing a lot of walking and thought that this was related.  States that it does not really improve with elevation.  No history of blood clots but does report a family history.  Has had some intermittent dizziness and shortness of breath.  Not currently short of breath.  No chest pain.  No history of heart failure and does not take any Lasix.  Prior to Admission medications   Medication Sig Start Date End Date Taking? Authorizing Provider  furosemide (LASIX) 20 MG tablet Take 1 tablet (20 mg total) by mouth daily. 06/04/24  Yes Ranetta Armacost, Charmaine FALCON, MD  meclizine  (ANTIVERT ) 25 MG tablet Take 1 tablet (25 mg total) by mouth 3 (three) times daily as needed for dizziness. 02/28/19   Norris Will PARAS, PA-C  medroxyPROGESTERone (DEPO-PROVERA) 150 MG/ML injection Inject 150 mg into the muscle every 3 (three) months.     [provider]  rizatriptan  (MAXALT -MLT) 10 MG disintegrating tablet TAKE 1 TABLET BY MOUTH THREE TIMES DAILY AS NEEDED FOR MIGRAINE 12/18/19   Gayland Lauraine PARAS, NP  Topiramate  ER (TROKENDI  XR) 200 MG CP24 Take 1 capsule by mouth daily. 12/18/19   Gayland Lauraine PARAS, NP    Allergies: Keppra  [levetiracetam ]    Review of Systems  Constitutional:  Negative for fever.  Respiratory:  Positive for shortness of breath. Negative for cough.   Cardiovascular:  Positive for leg swelling. Negative for chest pain.  All other systems reviewed and are negative.   Updated Vital Signs BP 101/67    Pulse 74   Temp 99 F (37.2 C) (Oral)   Resp 16   Ht 1.626 m (5' 4)   Wt 93 kg   SpO2 99%   BMI 35.19 kg/m   Physical Exam Vitals and nursing note reviewed.  Constitutional:      Appearance: She is well-developed. She is obese. She is not ill-appearing.  HENT:     Head: Normocephalic and atraumatic.  Eyes:     Pupils: Pupils are equal, round, and reactive to light.  Cardiovascular:     Rate and Rhythm: Normal rate and regular rhythm.     Heart sounds: Normal heart sounds.  Pulmonary:     Effort: Pulmonary effort is normal. No respiratory distress.     Breath sounds: No wheezing.  Abdominal:     General: Bowel sounds are normal.     Palpations: Abdomen is soft.  Musculoskeletal:     Cervical back: Neck supple.     Comments: 1+ pitting edema through the ankles  Skin:    General: Skin is warm and dry.  Neurological:     Mental Status: She is alert and oriented to person, place, and time.  Psychiatric:        Mood and Affect: Mood normal.     (all labs ordered are listed, but only abnormal results are displayed) Labs Reviewed  BASIC METABOLIC PANEL WITH GFR - Abnormal; Notable for  the following components:      Result Value   Potassium 3.4 (*)    All other components within normal limits  CBC WITH DIFFERENTIAL/PLATELET  D-DIMER, QUANTITATIVE  BRAIN NATRIURETIC PEPTIDE    EKG: None  Radiology: DG Chest Portable 1 View Result Date: 06/04/2024 EXAM: 1 VIEW(S) XRAY OF THE CHEST 06/04/2024 01:38:00 AM COMPARISON: 10/26/2011. CLINICAL HISTORY: sob. Since Thursday: bilateral leg/feet edema, SHOB, nausea and upset stomach, dizziness FINDINGS: LUNGS AND PLEURA: No focal pulmonary opacity. No pulmonary edema. No pleural effusion. No pneumothorax. HEART AND MEDIASTINUM: No acute abnormality of the cardiac and mediastinal silhouettes. BONES AND SOFT TISSUES: No acute osseous abnormality. IMPRESSION: 1. No acute abnormalities. Electronically signed by: Norman Gatlin MD  06/04/2024 01:51 AM EDT RP Workstation: [id number]     Procedures   Medications Ordered in the ED - No data to display                                  Medical Decision Making Amount and/or Complexity of Data Reviewed Labs: ordered. Radiology: ordered.  Risk Prescription drug management.   This patient presents to the ED for concern of leg swelling, this involves an extensive number of treatment options, and is a complaint that carries with it a high risk of complications and morbidity.  I considered the following differential and admission for this acute, potentially life threatening condition.  The differential diagnosis includes dependent edema, CHF, blood clots  MDM:    This is a 28 year old female who presents with lower extremity edema.  She is nontoxic her vitals signs are reassuring.  She reports occasional shortness of breath and dizziness.  History of vertigo.  No known history of blood clots.  Labs obtained.  CBC and BMP are reassuring.  Screening test for blood clots with a D-dimer is negative.  Edema is symmetric which is most suggestive of likely dependent edema versus volume shifts.  Discussed this with patient.  Will trial Lasix for 3 days.  She will follow-up with her primary doctor.  (Labs, imaging, consults)  Labs: I Ordered, and personally interpreted labs.  The pertinent results include: CBC, BMP, dimer  Imaging Studies ordered: I ordered imaging studies including chest x-ray I independently visualized and interpreted imaging. I agree with the radiologist interpretation  Additional history obtained from chart review.  External records from outside source obtained and reviewed including prior evaluations  Cardiac Monitoring: The patient was maintained on a cardiac monitor.  If on the cardiac monitor, I personally viewed and interpreted the cardiac monitored which showed an underlying rhythm of: Sinus  Reevaluation: After the interventions noted above, I  reevaluated the patient and found that they have :stayed the same  Social Determinants of Health:  lives independently  Disposition: Discharge  Co morbidities that complicate the patient evaluation  Past Medical History:  Diagnosis Date   Cerebral palsy (HCC)    Common migraine 08/23/2016   Disturbance of skin sensation 12/30/2013   Headache(784.0) 12/30/2013   Migraine    Pain in limb 12/30/2013   Seizures (HCC)    Syncope      Medicines Meds ordered this encounter  Medications   furosemide (LASIX) 20 MG tablet    Sig: Take 1 tablet (20 mg total) by mouth daily.    Dispense:  3 tablet    Refill:  0    I have reviewed the patients home medicines and have made adjustments as  needed  Problem List / ED Course: Problem List Items Addressed This Visit   None Visit Diagnoses       Peripheral edema    -  Primary                Final diagnoses:  Peripheral edema    ED Discharge Orders          Ordered    furosemide (LASIX) 20 MG tablet  Daily        06/04/24 0219               Bari Charmaine FALCON, MD 06/04/24 0221

## 2024-06-04 NOTE — ED Notes (Signed)
 Pt ambulatory to bathroom without difficulty.
